# Patient Record
Sex: Female | Born: 1940 | Race: White | Hispanic: No | Smoking: Former smoker
Health system: Southern US, Community
[De-identification: ages and names within clinical notes are randomized; demographics above are authoritative.]

## PROBLEM LIST (undated history)

## (undated) DIAGNOSIS — J302 Other seasonal allergic rhinitis: Secondary | ICD-10-CM

## (undated) DIAGNOSIS — C801 Malignant (primary) neoplasm, unspecified: Secondary | ICD-10-CM

## (undated) DIAGNOSIS — F419 Anxiety disorder, unspecified: Secondary | ICD-10-CM

## (undated) DIAGNOSIS — M8580 Other specified disorders of bone density and structure, unspecified site: Secondary | ICD-10-CM

## (undated) DIAGNOSIS — N3281 Overactive bladder: Secondary | ICD-10-CM

## (undated) DIAGNOSIS — S60419A Abrasion of unspecified finger, initial encounter: Secondary | ICD-10-CM

## (undated) DIAGNOSIS — Z923 Personal history of irradiation: Secondary | ICD-10-CM

## (undated) DIAGNOSIS — I10 Essential (primary) hypertension: Secondary | ICD-10-CM

## (undated) DIAGNOSIS — M1990 Unspecified osteoarthritis, unspecified site: Secondary | ICD-10-CM

## (undated) DIAGNOSIS — T4145XA Adverse effect of unspecified anesthetic, initial encounter: Secondary | ICD-10-CM

## (undated) DIAGNOSIS — H269 Unspecified cataract: Secondary | ICD-10-CM

## (undated) HISTORY — PX: CATARACT EXTRACTION: SUR2

## (undated) HISTORY — PX: BREAST SURGERY: SHX581

## (undated) HISTORY — PX: COLONOSCOPY: SHX174

## (undated) HISTORY — DX: Other specified disorders of bone density and structure, unspecified site: M85.80

## (undated) HISTORY — DX: Anxiety disorder, unspecified: F41.9

## (undated) HISTORY — DX: Essential (primary) hypertension: I10

## (undated) SURGERY — Surgical Case
Anesthesia: *Unknown

---
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
## 1971-10-11 DIAGNOSIS — T8859XA Other complications of anesthesia, initial encounter: Secondary | ICD-10-CM

## 1971-10-11 HISTORY — PX: TUBAL LIGATION: SHX77

## 1971-10-11 HISTORY — DX: Other complications of anesthesia, initial encounter: T88.59XA

## 2000-03-07 ENCOUNTER — Encounter: Payer: Self-pay | Admitting: *Deleted

## 2000-03-07 ENCOUNTER — Encounter: Admission: RE | Admit: 2000-03-07 | Discharge: 2000-03-07 | Payer: Self-pay | Admitting: *Deleted

## 2000-06-30 ENCOUNTER — Encounter: Admission: RE | Admit: 2000-06-30 | Discharge: 2000-06-30 | Payer: Self-pay | Admitting: Obstetrics and Gynecology

## 2000-06-30 ENCOUNTER — Encounter: Payer: Self-pay | Admitting: Obstetrics and Gynecology

## 2000-12-27 ENCOUNTER — Ambulatory Visit (HOSPITAL_COMMUNITY): Admission: RE | Admit: 2000-12-27 | Discharge: 2000-12-27 | Payer: Self-pay | Admitting: Obstetrics and Gynecology

## 2000-12-27 ENCOUNTER — Encounter: Payer: Self-pay | Admitting: Obstetrics and Gynecology

## 2001-01-31 ENCOUNTER — Ambulatory Visit (HOSPITAL_COMMUNITY): Admission: RE | Admit: 2001-01-31 | Discharge: 2001-01-31 | Payer: Self-pay | Admitting: Obstetrics and Gynecology

## 2001-01-31 ENCOUNTER — Encounter (INDEPENDENT_AMBULATORY_CARE_PROVIDER_SITE_OTHER): Payer: Self-pay

## 2001-01-31 ENCOUNTER — Encounter: Payer: Self-pay | Admitting: Obstetrics and Gynecology

## 2001-01-31 HISTORY — PX: DILATION AND CURETTAGE OF UTERUS: SHX78

## 2001-02-23 ENCOUNTER — Encounter: Payer: Self-pay | Admitting: Obstetrics and Gynecology

## 2001-02-23 ENCOUNTER — Encounter: Admission: RE | Admit: 2001-02-23 | Discharge: 2001-02-23 | Payer: Self-pay | Admitting: Obstetrics and Gynecology

## 2001-08-20 ENCOUNTER — Encounter: Payer: Self-pay | Admitting: Obstetrics and Gynecology

## 2001-08-20 ENCOUNTER — Encounter: Admission: RE | Admit: 2001-08-20 | Discharge: 2001-08-20 | Payer: Self-pay | Admitting: Obstetrics and Gynecology

## 2001-10-10 HISTORY — PX: POLYPECTOMY: SHX149

## 2001-11-09 ENCOUNTER — Encounter (INDEPENDENT_AMBULATORY_CARE_PROVIDER_SITE_OTHER): Payer: Self-pay | Admitting: *Deleted

## 2002-08-21 ENCOUNTER — Encounter: Payer: Self-pay | Admitting: Obstetrics and Gynecology

## 2002-08-21 ENCOUNTER — Encounter: Admission: RE | Admit: 2002-08-21 | Discharge: 2002-08-21 | Payer: Self-pay | Admitting: Obstetrics and Gynecology

## 2003-04-11 ENCOUNTER — Encounter: Admission: RE | Admit: 2003-04-11 | Discharge: 2003-04-11 | Payer: Self-pay | Admitting: Orthopedic Surgery

## 2003-04-11 ENCOUNTER — Encounter: Payer: Self-pay | Admitting: Orthopedic Surgery

## 2003-08-28 ENCOUNTER — Encounter: Admission: RE | Admit: 2003-08-28 | Discharge: 2003-08-28 | Payer: Self-pay | Admitting: Obstetrics and Gynecology

## 2004-02-05 ENCOUNTER — Other Ambulatory Visit: Admission: RE | Admit: 2004-02-05 | Discharge: 2004-02-05 | Payer: Self-pay | Admitting: Obstetrics and Gynecology

## 2004-02-10 ENCOUNTER — Ambulatory Visit (HOSPITAL_COMMUNITY): Admission: RE | Admit: 2004-02-10 | Discharge: 2004-02-10 | Payer: Self-pay | Admitting: Obstetrics and Gynecology

## 2004-03-04 ENCOUNTER — Other Ambulatory Visit: Admission: RE | Admit: 2004-03-04 | Discharge: 2004-03-04 | Payer: Self-pay | Admitting: Obstetrics and Gynecology

## 2004-08-31 ENCOUNTER — Encounter: Admission: RE | Admit: 2004-08-31 | Discharge: 2004-08-31 | Payer: Self-pay | Admitting: Obstetrics and Gynecology

## 2005-02-10 ENCOUNTER — Other Ambulatory Visit: Admission: RE | Admit: 2005-02-10 | Discharge: 2005-02-10 | Payer: Self-pay | Admitting: Obstetrics and Gynecology

## 2005-07-18 ENCOUNTER — Ambulatory Visit: Payer: Self-pay | Admitting: Internal Medicine

## 2005-07-22 ENCOUNTER — Ambulatory Visit: Payer: Self-pay | Admitting: Gastroenterology

## 2005-09-16 ENCOUNTER — Ambulatory Visit: Payer: Self-pay | Admitting: Gastroenterology

## 2005-10-12 ENCOUNTER — Encounter: Admission: RE | Admit: 2005-10-12 | Discharge: 2005-10-12 | Payer: Self-pay | Admitting: Obstetrics and Gynecology

## 2005-12-28 ENCOUNTER — Ambulatory Visit: Payer: Self-pay | Admitting: Gastroenterology

## 2006-02-13 ENCOUNTER — Other Ambulatory Visit: Admission: RE | Admit: 2006-02-13 | Discharge: 2006-02-13 | Payer: Self-pay | Admitting: Obstetrics and Gynecology

## 2006-03-01 ENCOUNTER — Encounter: Admission: RE | Admit: 2006-03-01 | Discharge: 2006-03-01 | Payer: Self-pay | Admitting: Obstetrics and Gynecology

## 2006-09-25 ENCOUNTER — Ambulatory Visit: Payer: Self-pay | Admitting: Gastroenterology

## 2006-10-13 ENCOUNTER — Encounter: Admission: RE | Admit: 2006-10-13 | Discharge: 2006-10-13 | Payer: Self-pay | Admitting: Obstetrics and Gynecology

## 2006-10-18 ENCOUNTER — Encounter: Admission: RE | Admit: 2006-10-18 | Discharge: 2006-10-18 | Payer: Self-pay | Admitting: Obstetrics and Gynecology

## 2006-10-20 ENCOUNTER — Encounter (INDEPENDENT_AMBULATORY_CARE_PROVIDER_SITE_OTHER): Payer: Self-pay | Admitting: *Deleted

## 2006-10-20 ENCOUNTER — Ambulatory Visit: Payer: Self-pay | Admitting: Gastroenterology

## 2007-02-26 ENCOUNTER — Other Ambulatory Visit: Admission: RE | Admit: 2007-02-26 | Discharge: 2007-02-26 | Payer: Self-pay | Admitting: Obstetrics and Gynecology

## 2007-04-19 ENCOUNTER — Encounter: Admission: RE | Admit: 2007-04-19 | Discharge: 2007-04-19 | Payer: Self-pay | Admitting: Obstetrics and Gynecology

## 2007-11-29 ENCOUNTER — Encounter: Admission: RE | Admit: 2007-11-29 | Discharge: 2007-11-29 | Payer: Self-pay | Admitting: Obstetrics and Gynecology

## 2007-12-07 ENCOUNTER — Encounter: Admission: RE | Admit: 2007-12-07 | Discharge: 2007-12-07 | Payer: Self-pay | Admitting: Obstetrics and Gynecology

## 2008-03-05 ENCOUNTER — Other Ambulatory Visit: Admission: RE | Admit: 2008-03-05 | Discharge: 2008-03-05 | Payer: Self-pay | Admitting: Obstetrics and Gynecology

## 2008-09-03 ENCOUNTER — Encounter: Admission: RE | Admit: 2008-09-03 | Discharge: 2008-09-03 | Payer: Self-pay | Admitting: Family Medicine

## 2008-12-10 ENCOUNTER — Encounter: Admission: RE | Admit: 2008-12-10 | Discharge: 2008-12-10 | Payer: Self-pay | Admitting: Obstetrics and Gynecology

## 2009-05-06 ENCOUNTER — Other Ambulatory Visit: Admission: RE | Admit: 2009-05-06 | Discharge: 2009-05-06 | Payer: Self-pay | Admitting: Obstetrics and Gynecology

## 2009-08-25 ENCOUNTER — Encounter: Admission: RE | Admit: 2009-08-25 | Discharge: 2009-08-25 | Payer: Self-pay | Admitting: Family Medicine

## 2009-09-15 ENCOUNTER — Encounter: Admission: RE | Admit: 2009-09-15 | Discharge: 2009-09-15 | Payer: Self-pay | Admitting: Family Medicine

## 2009-12-01 ENCOUNTER — Ambulatory Visit (HOSPITAL_COMMUNITY)
Admission: RE | Admit: 2009-12-01 | Discharge: 2009-12-03 | Payer: Self-pay | Source: Home / Self Care | Admitting: Obstetrics and Gynecology

## 2009-12-01 HISTORY — PX: LAPAROSCOPIC ASSISTED VAGINAL HYSTERECTOMY: SHX5398

## 2009-12-11 ENCOUNTER — Encounter: Admission: RE | Admit: 2009-12-11 | Discharge: 2009-12-11 | Payer: Self-pay | Admitting: Family Medicine

## 2010-04-06 ENCOUNTER — Encounter: Payer: Self-pay | Admitting: Gastroenterology

## 2010-05-14 ENCOUNTER — Ambulatory Visit: Payer: Self-pay | Admitting: Gastroenterology

## 2010-05-14 DIAGNOSIS — R197 Diarrhea, unspecified: Secondary | ICD-10-CM | POA: Insufficient documentation

## 2010-05-17 LAB — CONVERTED CEMR LAB: IgA: 319 mg/dL (ref 68–378)

## 2010-05-19 LAB — CONVERTED CEMR LAB: Tissue Transglutaminase Ab, IgA: 12 units (ref <20)

## 2010-06-28 ENCOUNTER — Encounter
Admission: RE | Admit: 2010-06-28 | Discharge: 2010-09-26 | Payer: Self-pay | Source: Home / Self Care | Attending: Endocrinology | Admitting: Endocrinology

## 2010-11-09 NOTE — Procedures (Signed)
Summary: Colon   Colonoscopy  Procedure date:  10/20/2006  Findings:      Location:  [facility].    Colonoscopy  Procedure date:  10/20/2006  Findings:      Location:  [facility].   Patient Name: Joy Owens, Joy Owens MRN:  Procedure Procedures: Colonoscopy CPT: [id number].    with polypectomy. CPT: [id number].  Personnel: Endoscopist: Rachael Fee, MD.  Exam Location: Exam performed in Endoscopy Suite. Outpatient  Patient Consent: Procedure, Alternatives, Risks and Benefits discussed, consent obtained, from patient. Consent was obtained by the RN.  Indications  Surveillance of: Adenomatous Polyp(s). Initial polypectomy was performed in 2003.  History  Current Medications: Patient is not currently taking Coumadin.  Allergies: No known allergies.  Comments: Patient history reviewed and updated, pre-procedure physical performed prior to initiation of sedation? yes Pre-Exam Physical: Performed Oct 20, 2006. Cardio-pulmonary exam, Cardio-pulmonary exam, Abdominal exam, Abdominal exam, Mental status exam, Mental status exam WNL.  Exam Exam: Extent of exam reached: Cecum, extent intended: Cecum.  The cecum was identified by appendiceal orifice and IC valve. Patient position: on left side. Time to Cecum: 00:06:59. Time for Withdrawl: 00:07:48. Colon retroflexion performed. Images taken. ASA Classification: II. Tolerance: good.  Monitoring: Pulse and BP monitoring, Oximetry used. Supplemental O2 given.  Colon Prep Prep results: good.  Sedation Meds: Patient assessed and found to be appropriate for moderate (conscious) sedation. Fentanyl 100 mcg. given IV. Versed 8 mg. given IV.  Findings POLYP: Descending Colon, Maximum size: 3 mm. sessile polyp. Procedure:  snare without cautery, removed, retrieved, Polyp sent to pathology. Path # 1.  - DIVERTICULOSIS: Descending Colon to Sigmoid Colon.  - NORMAL EXAM: Cecum to Rectum. Comments: otherwise  normal examination.   Assessment Abnormal examination, see findings above.  Comments: Single small colon polyp, no cancers. Events  Unplanned Interventions: No intervention was required.  Unplanned Events: There were no complications. Plans Comments: Even if this polyp is hyperplastic, she will need repeat colonoscopy in 5 years given her personal history of adenomatous polyps (2003). Scheduling/Referral: Await pathology to schedule patient.  This report was created from the original endoscopy report, which was reviewed and signed by the above listed endoscopist.

## 2010-11-09 NOTE — Assessment & Plan Note (Signed)
History of Present Illness Visit Type: Initial Visit Primary GI MD: Rob Bunting MD Primary Provider: Maurice Small, MD Chief Complaint: Uncontrollable loose bowels within one hour after meals. History of Present Illness:     very pleasant 70 year old woman whom I last saw in 2008 for polyp surveillance. She is having trouble with urinary bladder, urgency. She is also having issues with bowels, urgency, fecal incontinence.  She has had post prandial urgency at times as well, more recently usually loose now as well.    She feels she lack bowel control in the past 6 months.  She had a hysterectomy in February with Dr. Perlie Gold.  She saw a urologist in past few months, was given gelnique...this helps some with the bladder problems.  she will have 1 bm a day, sometimes twice a day.  Urgency is the biggest issue.  still has GB in place.  she has been on zoloft for years, no changes in doses. she has been on micardis for 6-7 years,  no recent changes in doses.  has not tried immodium.  no antibiotics recently.           Current Medications (verified): 1)  Micardis 40 Mg Tabs (Telmisartan) .... One Tablet By Mouth Once Daily 2)  Sertraline Hcl 50 Mg Tabs (Sertraline Hcl) .... One Tablet By Mouth Once Daily 3)  Gelnique 10 % Gel (Oxybutynin Chloride) .... Apply Once Every Two Days 4)  Ambien 5 Mg Tabs (Zolpidem Tartrate) .... One Tablet By Mouth At Bedtime As Needed  Allergies (verified): No Known Drug Allergies  Past History:  Past Medical History: dyspepsia colon polyps, recall colonoscopy 2013 cholelithiasis, incidental Hypertension  Past Surgical History: Tubal Ligation  hysterectomy 2011  Family History: no colon cancer  Social History: she is divorced, she has 2 children, she is retired, she quit smoking, she drinks one large tea per day, one glass of wine per day  Review of Systems       Pertinent positive and negative review of systems were noted in the  above HPI and GI specific review of systems.  All other review of systems was otherwise negative.   Vital Signs:  Patient profile:   70 year old female Height:      62.25 inches Weight:      179.25 pounds BMI:     32.64 Pulse rate:   80 / minute Pulse rhythm:   regular BP sitting:   122 / 72  (left arm) Cuff size:   regular  Vitals Entered By: Christie Nottingham CMA Duncan Dull) (May 14, 2010 10:09 AM)  Physical Exam  Additional Exam:  Constitutional: generally well appearing Psychiatric: alert and oriented times 3 Eyes: extraocular movements intact Mouth: oropharynx moist, no lesions Neck: supple, no lymphadenopathy Cardiovascular: heart regular rate and rythm Lungs: CTA bilaterally Abdomen: soft, non-tender, non-distended, no obvious ascites, no peritoneal signs, normal bowel sounds Extremities: no lower extremity edema bilaterally Skin: no lesions on visible extremities    Impression & Recommendations:  Problem # 1:  loose stools, fecal incontinence this may be dietary related, she drinks a 32 ounce tea everyday. It does seem like she has some issues with lactose as well. We will get recent labs sent over to see if her thyroid has been checked. If not we will add TSH to her labs. I will check her for celiac sprue serologically. Depending on those results we may proceed with a flexible sigmoidoscopy. In the meantime she will take one Imodium on a  scheduled basis every day.  Other Orders: TLB-IgA (Immunoglobulin A) ([id number]) T-Tissue Transglutamase Ab IgA ([id number])  Patient Instructions: 1)  You will get lab test(s) done today (tTG, total IgA). 2)  Will get records from Dr. Tennis Must recent lab tests. 3)  Will decide about flex sigmoidoscopy based on lab results. 4)  In the meantime, take one immodium a day in the AM on a scheduled basis, only stop if you become constipated. 5)  The medication list was reviewed and reconciled.  All changed / newly prescribed medications  were explained.  A complete medication list was provided to the patient / caregiver.  Appended Document:  received faxed labs dated 04/06/2010: thryoid profile was all normal.

## 2010-11-09 NOTE — Procedures (Signed)
Summary: Colon   Colonoscopy  Procedure date:  11/09/2001  Findings:      Location:  [facility].    Colonoscopy  Procedure date:  11/09/2001  Findings:      Location:  [facility].   Patient Name: Joy Owens, Doyel MRN:  Procedure Procedures: Colonoscopy CPT: [phone].  Personnel: Endoscopist: Ulyess Mort, MD.  Referred By: Beather Arbour. Thomasena Edis, MD.  Exam Location: Exam performed in Outpatient Clinic. Outpatient  Patient Consent: Procedure, Alternatives, Risks and Benefits discussed, consent obtained, from patient. Consent was obtained by the RN.  Indications  Average Risk Screening Routine.  History Allergies: No known allergies.  Pre-Exam Physical: Performed Nov 09, 2001. Cardio-pulmonary exam, Rectal exam, HEENT exam , Abdominal exam, Neurological exam, Mental status exam WNL.  Exam Exam: Extent of exam reached: Cecum, extent intended: Cecum.  The cecum was identified by appendiceal orifice and IC valve. Colon retroflexion performed. Images taken. ASA Classification: I. Tolerance: good.  Monitoring: Pulse and BP monitoring, Oximetry used. Supplemental O2 given.  Colon Prep Prep results: excellent.  Sedation Meds: Patient assessed and found to be appropriate for moderate (conscious) sedation. Fentanyl 100 mcg. given IV. Versed 10 mg. given IV.  Findings - NOT SEEN ON EXAM: Cecum to Sigmoid Colon. Polyps, AVM's, Colitis, Tumors, Melanosis, Crohn's, Hemorrhoids, Comments: mild l sigmoid diverticulosis.   Assessment Abnormal examination, see findings above.  Events  Unplanned Interventions: No intervention was required.  Unplanned Events: There were no complications. Plans Medication Plan: Fiber supplements:   Patient Education: Patient given standard instructions for: Diverticulosis. Yearly hemoccult testing recommended. Patient instructed to get routine colonoscopy every 5 years.  Disposition: After procedure  patient sent to recovery. After recovery patient sent home.  This report was created from the original endoscopy report, which was reviewed and signed by the above listed endoscopist.

## 2010-11-11 ENCOUNTER — Other Ambulatory Visit: Payer: Self-pay | Admitting: Obstetrics and Gynecology

## 2010-11-11 DIAGNOSIS — Z1239 Encounter for other screening for malignant neoplasm of breast: Secondary | ICD-10-CM

## 2010-12-13 ENCOUNTER — Ambulatory Visit
Admission: RE | Admit: 2010-12-13 | Discharge: 2010-12-13 | Disposition: A | Discharge status: Home / Self Care | Payer: Medicare Other | Source: Ambulatory Visit | Attending: Obstetrics and Gynecology | Admitting: Obstetrics and Gynecology

## 2010-12-13 DIAGNOSIS — Z1239 Encounter for other screening for malignant neoplasm of breast: Secondary | ICD-10-CM

## 2010-12-29 LAB — CBC
HCT: 32.6 % — ABNORMAL LOW (ref 36.0–46.0)
HCT: 40.3 % (ref 36.0–46.0)
Hemoglobin: 11.1 g/dL — ABNORMAL LOW (ref 12.0–15.0)
Hemoglobin: 13.7 g/dL (ref 12.0–15.0)
MCHC: 33.9 g/dL (ref 30.0–36.0)
MCHC: 33.9 g/dL (ref 30.0–36.0)
MCV: 88.9 fL (ref 78.0–100.0)
MCV: 89.9 fL (ref 78.0–100.0)
Platelets: 244 10*3/uL (ref 150–400)
Platelets: 288 10*3/uL (ref 150–400)
RBC: 3.63 MIL/uL — ABNORMAL LOW (ref 3.87–5.11)
RBC: 4.53 MIL/uL (ref 3.87–5.11)
RDW: 12.9 % (ref 11.5–15.5)
RDW: 12.9 % (ref 11.5–15.5)
WBC: 10.2 10*3/uL (ref 4.0–10.5)
WBC: 5.4 10*3/uL (ref 4.0–10.5)

## 2010-12-29 LAB — BASIC METABOLIC PANEL
BUN: 16 mg/dL (ref 6–23)
CO2: 28 mEq/L (ref 19–32)
Calcium: 9.4 mg/dL (ref 8.4–10.5)
Chloride: 105 mEq/L (ref 96–112)
Creatinine, Ser: 0.9 mg/dL (ref 0.4–1.2)
GFR calc Af Amer: >60 mL/min (ref >60)
GFR calc non Af Amer: >60 mL/min (ref >60)
Glucose, Bld: 100 mg/dL — ABNORMAL HIGH (ref 70–99)
Potassium: 4 mEq/L (ref 3.5–5.1)
Sodium: 141 mEq/L (ref 135–145)

## 2011-02-09 ENCOUNTER — Ambulatory Visit: Payer: BC Managed Care – PPO | Admitting: Dietician

## 2011-02-25 NOTE — Op Note (Signed)
[facility]  Patient:    Joy Owens, Joy Owens                    MRN: [id number] Proc. Date: 01/31/01 Adm. Date:  [id number] Attending:  Madelyn Flavors CC:         Deboraha Sprang GYN  Dr. [facility] [employer]   Operative Report  PREOPERATIVE DIAGNOSES:       1. Postmenopausal bleeding.                               2. Endometrial polyp sonohystogram.  POSTOPERATIVE DIAGNOSES:      1. Postmenopausal bleeding.                               2. Endometrial polyp sonohystogram.  PROCEDURES:                   1. Dilatation and curettage.                               2. Hysteroscopy.  SURGEON:                      Beather Arbour. Thomasena Edis, M.D.  ANESTHESIA:                   Monitored anesthesia care plus 10 cc 1% lidocaine paracervical block.  DRAINS:                       None.  COMPLICATIONS:                None.  FLUIDS:                       Approximately 1000 cc of crystalloid,  FINDINGS:                     Polyp on the anterior uterine wall.  DESCRIPTION OF PROCEDURE:     The patient was brought to the operating room and identified on the operating room table.  After the patient was adequately sedated using monitored anesthesia care, she was placed in the dorsal lithotomy position, prepped and draped in the usual sterile fashion.  The bladder was straight catheterized for approximately 100 cc of clear yellow urine.  It should be noted that it was quite difficult to get the patient to open her legs and place them into the [place] stirrups.  In fact, she kept them clenched together and, only with my coaxing was she willing to let her legs fall apart and allow [place] to place them in stirrups.  Bimanual examination was performed.  The uterus was noted to be anteverted, approximately six week size with no adnexal mass palpated.  A speculum was placed and the anterior lip of the cervix was infiltrated with 1 cc of 1% lidocaine and  grasped with a single-tooth tenaculum.  The remaining 9 cc of 1% lidocaine was placed for paracervical block.  The cervix was very carefully and gently dilated up to a #25 Pratt dilator.  Dilatation proceeded very carefully and slowly to decrease the risk of uterine perforation.  Initially, it was slightly difficult to dilate the patients cervix, as the internal os and external os  did not line up together.  However, just with allowing very gentle pressure, the dilator was allowed to fall into the internal os.  The ACMI scope was placed after the uterus sounded to approximately 7 cm.  A very careful and thorough hysteroscopic examination was performed.  Both tubal ostia were identified. The polyp was identified on the anterior wall.  There was some thickened endometrial tissue on the posterior wall of the uterus and adjacent to the left tubal ostium.  After carefully examining the endometrial cavity, the scope was withdrawn and curettage was performed in a systematic cross fashion, with tissue obtained.  The Randall stone forceps were placed and additional tissue was obtained.  The ACMI scope was again placed and the polyp was noted to have been removed in its entirety as well as the endometrial tissue at the left tubal ostium.  The thickened endometrium on the posterior wall was also noted to have been removed.  At that point, the procedure was then terminated. The tenaculum was withdrawn after removal of the scope.  There was noted to be no bleeding from the tenaculum site.  The speculum was withdrawn.  The patient tolerated the procedure well without apparent complications and was transferred to the recovery room in stable condition after sponge, needle and instrument counts were correct.  The patient was given a post D&C instruction sheet, urged to call with any problems, and to return in 2-3 weeks for a postoperative evaluation.  She was urged to use ibuprofen, 400-600 mg q.6h. p.r.n.  pain and to call for any temperature greater than 100 degrees or any problem.  She is to refrain from intercourse for 2-3 weeks until her postoperative visit. DD:  01/31/01 TD:  02/01/01 Job: [id number]

## 2011-02-25 NOTE — H&P (Signed)
[facility]  Patient:    Joy Owens, Joy Owens                      MRN: [id number] Adm. Date:  01/31/01 Dictator:   Beather Arbour. Thomasena Edis, M.D. CC:         Al Decant. Janey Greaser, M.D.,  [facility]   History and Physical  HISTORY OF PRESENT ILLNESS:   The patient is a 70 year old gravida 2, para 2, Caucasian female, originally seen on December 20, 2000, for annual examination. In taking her history I discovered that approximately six months or a year or so ago she took unopposed estrogen.  Her mother was recently diagnosed with endometrial cancer, having taken unopposed estrogen for years and without informing her physician.  At that time the patient also was complaining of some postmenopausal bleeding.  She subsequently underwent an ultrasound and sonohysterogram which showed a normal uterus at 8.5 cm x 3.7 x 4.8.  Ovaries were normal.  Sonohysterogram revealed a small endometrial polyp, and the patient is now admitted for a D&C and hysteroscopy to remove the polyp and obtain endometrial tissue to rule out endometrial hyperplasia due to her history of unopposed estrogen therapy.  PAST MEDICAL HISTORY: 1. History of IUD years ago without any infection. 2. 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 3. Postmenopausal bleeding.  The patient has been bleeding 10-14 days each    month since last summer. 4. Osteopenia of the left hip. 5. In addition, the patient was in our office complaining of some shortness    of breath and lightheadedness for which she was evaluated by [facility].  ALLERGIES:                    No known drug allergies.  CURRENT MEDICATIONS:          Premphase and Fosamax.  SOCIAL HISTORY:               The patient recently retired.  She does not smoke, has five alcohol drinks per week.  PAST SURGICAL HISTORY:        Tubal ligation years ago, otherwise  negative.  INJURIES:                     None.  FAMILY HISTORY:               Father age 53 with hypertension, possible Parkinsons disease and degenerative eye disease.  Mother age 74 recently diagnosed with endometrial cancer and also with a history of hypertension. The patient has one brother age 68 with coronary artery disease treated with angioplasty and a sister age 22 alive and well.  She does give a family history significant for heart disease in many relatives.  She has two daughters both of whom are alive and well.  PHYSICAL EXAMINATION:  GENERAL:                      A well-developed Caucasian female.  VITAL SIGNS:                  Blood pressure 148/94, height 62-1/4 inches. LMP November 28, 2000.  HEENT:                        Normal.  NECK:  Supple without thyromegaly, adenopathy or nodules.  LUNGS:                        Clear to auscultation.  CARDIAC EXAM:                 Regular rate and rhythm without murmurs, rubs or gallops.  BREASTS:                      Without masses, nodes, dimpling, retraction.  ABDOMEN:                      Soft, nontender, no hepatosplenomegaly, no masses.  PELVIC EXAMINATION:           BUS normal.  A careful inspection of the vulva, vagina and cervix reveals no visible lesions.  Pap smear deep in the canal was performed.  Uterus is midposition, six weeks, mobile, without any adnexal mass palpated.  RECTAL:                       Confirmatory, no mass.  ASSESSMENT AND PLAN:          The patient is a 70 year old gravida 2, para 2, Caucasian female with a history of unopposed estrogen use, postmenopausal bleeding since last summer and an endometrial polyp on sonohysterogram admitted for a D&C and hysteroscopy.  The risks of surgery including anesthetic complication; hemorrhage; infection; damage to adjacent structures including bladder, bowel, blood vessels, ureters discussed with the patient. She was made  aware of the risk of uterine perforation which could result in overwhelming life-threatening hemorrhage requiring emergent hysterectomy.  She expressed understanding of and acceptance of these risks.  In addition, she was made aware of the risk of uterine perforation which could result in bowel damage requiring emergent colostomy or which could result in overwhelming life-threatening peritonitis.  She expressed understanding of and acceptance of these risks. DD:  02/01/99 TD:  01/31/01 Job: [id number]

## 2011-03-03 ENCOUNTER — Encounter: Payer: Medicare Other | Attending: Endocrinology | Admitting: Dietician

## 2011-08-22 ENCOUNTER — Other Ambulatory Visit: Payer: Self-pay | Admitting: Obstetrics and Gynecology

## 2011-08-22 DIAGNOSIS — N63 Unspecified lump in unspecified breast: Secondary | ICD-10-CM

## 2011-09-05 ENCOUNTER — Ambulatory Visit
Admission: RE | Admit: 2011-09-05 | Discharge: 2011-09-05 | Disposition: A | Discharge status: Home / Self Care | Payer: Medicare Other | Source: Ambulatory Visit | Attending: Family Medicine | Admitting: Family Medicine

## 2011-09-05 ENCOUNTER — Ambulatory Visit
Admission: RE | Admit: 2011-09-05 | Discharge: 2011-09-05 | Disposition: A | Discharge status: Home / Self Care | Payer: BC Managed Care – PPO | Source: Ambulatory Visit | Attending: Obstetrics and Gynecology | Admitting: Obstetrics and Gynecology

## 2011-09-05 ENCOUNTER — Other Ambulatory Visit: Payer: Self-pay | Admitting: Family Medicine

## 2011-09-05 DIAGNOSIS — R05 Cough: Secondary | ICD-10-CM

## 2011-09-05 DIAGNOSIS — R059 Cough, unspecified: Secondary | ICD-10-CM

## 2011-09-05 DIAGNOSIS — N63 Unspecified lump in unspecified breast: Secondary | ICD-10-CM

## 2011-09-22 ENCOUNTER — Encounter: Payer: Self-pay | Admitting: Gastroenterology

## 2011-10-05 ENCOUNTER — Ambulatory Visit (AMBULATORY_SURGERY_CENTER): Payer: Medicare Other

## 2011-10-05 DIAGNOSIS — H269 Unspecified cataract: Secondary | ICD-10-CM | POA: Insufficient documentation

## 2011-10-05 DIAGNOSIS — Z1211 Encounter for screening for malignant neoplasm of colon: Secondary | ICD-10-CM

## 2011-10-05 MED ORDER — PEG-KCL-NACL-NASULF-NA ASC-C 100 G PO SOLR: 1.0000 | Freq: Once | ORAL | Status: DC

## 2011-10-19 ENCOUNTER — Ambulatory Visit (AMBULATORY_SURGERY_CENTER): Payer: Medicare Other | Admitting: Gastroenterology

## 2011-10-19 ENCOUNTER — Encounter: Payer: Self-pay | Admitting: Gastroenterology

## 2011-10-19 VITALS — BP 113/70 | HR 64 | Temp 97.6°F | Resp 20 | Ht 62.5 in | Wt 160.0 lb

## 2011-10-19 DIAGNOSIS — Z1211 Encounter for screening for malignant neoplasm of colon: Secondary | ICD-10-CM

## 2011-10-19 DIAGNOSIS — K5730 Diverticulosis of large intestine without perforation or abscess without bleeding: Secondary | ICD-10-CM

## 2011-10-19 DIAGNOSIS — Z86010 Personal history of colonic polyps: Secondary | ICD-10-CM

## 2011-10-19 MED ORDER — SODIUM CHLORIDE 0.9 % IV SOLN: 500.0000 mL | INTRAVENOUS | Status: DC

## 2011-10-19 NOTE — Progress Notes (Signed)
Abdominal pressure applied to reach cecum 

## 2011-10-19 NOTE — Patient Instructions (Signed)
Resume all medications. D/Cinstructions reviewed with family.

## 2011-10-19 NOTE — Progress Notes (Signed)
Patient did not experience any of the following events: a burn prior to discharge; a fall within the facility; wrong site/side/patient/procedure/implant event; or a hospital transfer or hospital admission upon discharge from the facility. [phone]) Patient did not have preoperative order for IV antibiotic SSI prophylaxis. (J4782) Dr. Christella Hartigan MADE AWARE OF B/P PRIOR TO D/C PT. Stable skin warm and dry A&O X3.

## 2011-10-19 NOTE — Op Note (Signed)
[facility] [address] [place], [place]  [address]  COLONOSCOPY PROCEDURE REPORT  PATIENT:  Joy Owens, Joy Owens  MR#:  [id number] BIRTHDATE:  1940-12-16, 70 yrs. old  GENDER:  female ENDOSCOPIST:  Rachael Fee, MD PROCEDURE DATE:  10/19/2011 PROCEDURE:  Colonoscopy 62130 ASA CLASS:  Class II INDICATIONS:  adenomatous polyp 2003, HP in 2008 MEDICATIONS:   Fentanyl 50 mcg IV, These medications were titrated to patient response per physician's verbal order, Versed 8 mg IV  DESCRIPTION OF PROCEDURE:   After the risks benefits and alternatives of the procedure were thoroughly explained, informed consent was obtained.  Digital rectal exam was performed and revealed no rectal masses.   The LB [id number] endoscope was introduced through the anus and advanced to the cecum, which was identified by both the appendix and ileocecal valve, without limitations.  The quality of the prep was good..  The instrument was then slowly withdrawn as the colon was fully examined. <<PROCEDUREIMAGES>> FINDINGS:  Mild diverticulosis was found in the sigmoid to descending colon segments (see image1).  This was otherwise a normal examination of the colon (see image2, image3, and image4). Retroflexed views in the rectum revealed no abnormalities. COMPLICATIONS:  None  ENDOSCOPIC IMPRESSION: 1) Mild diverticulosis in the sigmoid to descending colon segments 2) Otherwise normal examination  RECOMMENDATIONS: 1) Given your personal history of adenomatous (pre-cancerous) polyps, you will need a repeat colonoscopy in 5 years.  REPEAT EXAM:  5 years  ______________________________ Rachael Fee, MD  n. eSIGNED:   Rachael Fee at 10/19/2011 09:39 AM  Fosse, Myrene Buddy, [phone]

## 2011-10-20 ENCOUNTER — Telehealth: Payer: Self-pay | Admitting: *Deleted

## 2011-10-20 NOTE — Telephone Encounter (Signed)
Left message on number given in admitting yest as per pt. ewm

## 2011-12-01 ENCOUNTER — Other Ambulatory Visit: Payer: Self-pay | Admitting: Obstetrics and Gynecology

## 2011-12-08 ENCOUNTER — Other Ambulatory Visit: Payer: Self-pay | Admitting: Obstetrics and Gynecology

## 2011-12-08 DIAGNOSIS — Z1231 Encounter for screening mammogram for malignant neoplasm of breast: Secondary | ICD-10-CM

## 2011-12-21 ENCOUNTER — Ambulatory Visit
Admission: RE | Admit: 2011-12-21 | Discharge: 2011-12-21 | Disposition: A | Discharge status: Home / Self Care | Payer: Medicare Other | Source: Ambulatory Visit | Attending: Obstetrics and Gynecology | Admitting: Obstetrics and Gynecology

## 2011-12-21 DIAGNOSIS — Z1231 Encounter for screening mammogram for malignant neoplasm of breast: Secondary | ICD-10-CM

## 2011-12-22 ENCOUNTER — Other Ambulatory Visit: Payer: Self-pay | Admitting: Obstetrics and Gynecology

## 2011-12-22 DIAGNOSIS — R928 Other abnormal and inconclusive findings on diagnostic imaging of breast: Secondary | ICD-10-CM

## 2011-12-30 ENCOUNTER — Other Ambulatory Visit: Payer: Self-pay | Admitting: Obstetrics and Gynecology

## 2011-12-30 ENCOUNTER — Ambulatory Visit
Admission: RE | Admit: 2011-12-30 | Discharge: 2011-12-30 | Disposition: A | Discharge status: Home / Self Care | Payer: Medicare Other | Source: Ambulatory Visit | Attending: Obstetrics and Gynecology | Admitting: Obstetrics and Gynecology

## 2011-12-30 ENCOUNTER — Other Ambulatory Visit: Payer: Self-pay | Admitting: Diagnostic Radiology

## 2011-12-30 DIAGNOSIS — R928 Other abnormal and inconclusive findings on diagnostic imaging of breast: Secondary | ICD-10-CM

## 2011-12-30 HISTORY — PX: BREAST BIOPSY: SHX20

## 2012-01-02 ENCOUNTER — Other Ambulatory Visit: Payer: Self-pay | Admitting: Obstetrics and Gynecology

## 2012-01-02 ENCOUNTER — Ambulatory Visit
Admission: RE | Admit: 2012-01-02 | Discharge: 2012-01-02 | Disposition: A | Discharge status: Home / Self Care | Payer: Medicare Other | Source: Ambulatory Visit | Attending: Obstetrics and Gynecology | Admitting: Obstetrics and Gynecology

## 2012-01-02 DIAGNOSIS — R928 Other abnormal and inconclusive findings on diagnostic imaging of breast: Secondary | ICD-10-CM

## 2012-01-02 DIAGNOSIS — C50911 Malignant neoplasm of unspecified site of right female breast: Secondary | ICD-10-CM

## 2012-01-05 ENCOUNTER — Ambulatory Visit
Admission: RE | Admit: 2012-01-05 | Discharge: 2012-01-05 | Disposition: A | Discharge status: Home / Self Care | Payer: Medicare Other | Source: Ambulatory Visit | Attending: Obstetrics and Gynecology | Admitting: Obstetrics and Gynecology

## 2012-01-05 ENCOUNTER — Other Ambulatory Visit: Payer: Self-pay | Admitting: *Deleted

## 2012-01-05 DIAGNOSIS — C50911 Malignant neoplasm of unspecified site of right female breast: Secondary | ICD-10-CM

## 2012-01-05 DIAGNOSIS — C50411 Malignant neoplasm of upper-outer quadrant of right female breast: Secondary | ICD-10-CM | POA: Insufficient documentation

## 2012-01-05 DIAGNOSIS — C50419 Malignant neoplasm of upper-outer quadrant of unspecified female breast: Secondary | ICD-10-CM

## 2012-01-05 MED ORDER — GADOBENATE DIMEGLUMINE 529 MG/ML IV SOLN
15.0000 mL | Freq: Once | INTRAVENOUS | Status: AC | PRN
  Administered 2012-01-05: 15 mL via INTRAVENOUS

## 2012-01-06 ENCOUNTER — Telehealth: Payer: Self-pay | Admitting: Oncology

## 2012-01-06 NOTE — Telephone Encounter (Signed)
Left a message to follow up to make sure all questions were answered regarding her mammograms.  I left my number should she have any more questions.   I asked Dr. Deboraha Sprang to contact the patient.

## 2012-01-09 ENCOUNTER — Telehealth: Payer: Self-pay | Admitting: Oncology

## 2012-01-09 DIAGNOSIS — C801 Malignant (primary) neoplasm, unspecified: Secondary | ICD-10-CM

## 2012-01-09 HISTORY — DX: Malignant (primary) neoplasm, unspecified: C80.1

## 2012-01-09 NOTE — Telephone Encounter (Signed)
I called to follow up with the patient, after Dr. Deboraha Sprang called her-  She is concerned that her newly diagnosed breast cancer was on her previous mammogram.   She would like Dr. Michell Heinrich to review her case and talk to her about it during her consult on Wednesday.  I will relay her request.

## 2012-01-11 ENCOUNTER — Encounter: Payer: Self-pay | Admitting: Oncology

## 2012-01-11 ENCOUNTER — Ambulatory Visit (HOSPITAL_BASED_OUTPATIENT_CLINIC_OR_DEPARTMENT_OTHER): Payer: Medicare Other | Admitting: Surgery

## 2012-01-11 ENCOUNTER — Ambulatory Visit: Payer: Medicare Other

## 2012-01-11 ENCOUNTER — Other Ambulatory Visit (HOSPITAL_BASED_OUTPATIENT_CLINIC_OR_DEPARTMENT_OTHER): Payer: Medicare Other | Admitting: Lab

## 2012-01-11 ENCOUNTER — Ambulatory Visit: Payer: Medicare Other | Attending: Surgery | Admitting: Physical Therapy

## 2012-01-11 ENCOUNTER — Ambulatory Visit
Admission: RE | Admit: 2012-01-11 | Discharge: 2012-01-11 | Disposition: A | Discharge status: Home / Self Care | Payer: Medicare Other | Source: Ambulatory Visit | Attending: Radiation Oncology | Admitting: Radiation Oncology

## 2012-01-11 ENCOUNTER — Telehealth: Payer: Self-pay | Admitting: *Deleted

## 2012-01-11 ENCOUNTER — Ambulatory Visit (HOSPITAL_BASED_OUTPATIENT_CLINIC_OR_DEPARTMENT_OTHER): Payer: Medicare Other | Admitting: Oncology

## 2012-01-11 VITALS — BP 128/83 | HR 67 | Temp 98.5°F | Wt 162.0 lb

## 2012-01-11 VITALS — BP 128/83 | HR 67 | Temp 98.5°F | Ht 62.0 in | Wt 162.1 lb

## 2012-01-11 DIAGNOSIS — M949 Disorder of cartilage, unspecified: Secondary | ICD-10-CM

## 2012-01-11 DIAGNOSIS — Z170 Estrogen receptor positive status [ER+]: Secondary | ICD-10-CM

## 2012-01-11 DIAGNOSIS — R293 Abnormal posture: Secondary | ICD-10-CM | POA: Insufficient documentation

## 2012-01-11 DIAGNOSIS — E559 Vitamin D deficiency, unspecified: Secondary | ICD-10-CM

## 2012-01-11 DIAGNOSIS — C50419 Malignant neoplasm of upper-outer quadrant of unspecified female breast: Secondary | ICD-10-CM

## 2012-01-11 DIAGNOSIS — C50919 Malignant neoplasm of unspecified site of unspecified female breast: Secondary | ICD-10-CM | POA: Insufficient documentation

## 2012-01-11 DIAGNOSIS — IMO0001 Reserved for inherently not codable concepts without codable children: Secondary | ICD-10-CM | POA: Insufficient documentation

## 2012-01-11 LAB — COMPREHENSIVE METABOLIC PANEL
AST: 20 U/L (ref 0–37)
Alkaline Phosphatase: 52 U/L (ref 39–117)
BUN: 24 mg/dL — ABNORMAL HIGH (ref 6–23)
Creatinine, Ser: 0.81 mg/dL (ref 0.50–1.10)
Total Bilirubin: 0.3 mg/dL (ref 0.3–1.2)

## 2012-01-11 LAB — CBC WITH DIFFERENTIAL/PLATELET
BASO%: 1.1 % (ref 0.0–2.0)
EOS%: 3.7 % (ref 0.0–7.0)
HCT: 37.5 % (ref 34.8–46.6)
LYMPH%: 37.1 % (ref 14.0–49.7)
MCH: 30.6 pg (ref 25.1–34.0)
MCHC: 34.9 g/dL (ref 31.5–36.0)
NEUT%: 48.9 % (ref 38.4–76.8)
Platelets: 235 10*3/uL (ref 145–400)

## 2012-01-11 LAB — CANCER ANTIGEN 27.29: CA 27.29: 14 U/mL (ref 0–39)

## 2012-01-11 NOTE — Telephone Encounter (Signed)
gave patient follow up appointment for 02-09-2012 starting at 3:00pm printed out calendar and gave to the patient

## 2012-01-11 NOTE — Patient Instructions (Signed)
[facility] post will schedule surgery for you for a wire localized right breast lumpectomy and sentinel node removal. We will plan to do this as an outpatient. If you've not heard from my office by Thursday afternoon please call and we will check to see why there is a delay. Also call me if you have any questions or issues.

## 2012-01-11 NOTE — Progress Notes (Signed)
Referral MD Dr Maurice Small; Dr Claudie Fisherman  Reason for Referral: 71 year old woman referred for recent diagnosis of breast cancer.    Chief Complaint  Patient presents with  . Breast Cancer  : This patient is the previous good health. She has undergone annual screening mammography. Screening mammogram from 12/21/2011 showed a possible mass in the right breast. She presented on a mammogram in November of 2012 because of a small nodule seen in the right breast. This is felt to be exacerbation cysts. On the current mammogram no other abnormalities were seen. The patient was referred for biopsy. 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 Only a bright T2 lesion midthoracic spine is felt to be a hemangioma.   HPI:   Past Medical History  Diagnosis Date  . Anxiety   . Cataracts, bilateral     left eye  . Hypertension   . Osteopenia   . Diabetes mellitus     controlled with diet  :  Past Surgical History  Procedure Date  . Vaginal hysterectomy with BSO 12/01/2008  . Tubal ligation 1974    adverse reaction to sedation  . Colonoscopy 2003 2008  . Polypectomy 2003   . Tonsillectomy 1953  . Dilation and curettage of uterus 2002  :  Current outpatient prescriptions:calcium carbonate (OS-CAL) 600 MG TABS, Take 600 mg by mouth 2 (two) times daily with a meal., Disp: , Rfl: ;  cholecalciferol (VITAMIN D) 1000 UNITS tablet, Take 1,000 Units by mouth daily., Disp: , Rfl: ;  HYDROMET 5-1.5 MG/5ML syrup, , Disp: , Rfl: ;  MICARDIS HCT 40-12.5 MG per tablet, , Disp: , Rfl: ;  Multiple Vitamins-Minerals (MULTIVITAMIN PO), Take 1 tablet by mouth daily., Disp: , Rfl:  solifenacin (VESICARE) 5 MG tablet, Take 10 mg by  mouth daily., Disp: , Rfl: ;  zolpidem (AMBIEN) 5 MG tablet, Take 5 mg by mouth at bedtime as needed., Disp: , Rfl: :    :  No Known Allergies:  Family History  Problem Relation Age of Onset  . Colon cancer Neg Hx   :  History   Social History  . Marital Status: Divorced mx 72 y    Spouse Name: N/A    Number of Children: 2; david Arpino 41, melanie chrismon 44  . Years of Education: N/A   Occupational History  . Retired  [employer], social service    Social History Main Topics  . Smoking status: Former Smoker -- 1.5 packs/day for 15 years    Types: Cigarettes    Quit date: 10/10/1988  . Smokeless tobacco: Never Used  . Alcohol Use: 0.0 oz/week    5-7 Glasses of wine per week  . Drug Use: No  . Sexually Active: Not on file   Other Topics Concern  . Not on file   Social History Narrative   Retired from the [employer] [place] [employer] Service Divorced  :  @Reproductive  History@ G2P2 Menarche 14 Menopause @ hysterectomy HRT  X 14-15y  A comprehensive review of systems was negative.  Exam:  @IPVITALS @ General appearance: alert, cooperative and appears stated age Eyes: conjunctivae/corneas clear. PERRL, EOM's intact. Fundi benign. Throat: lips, mucosa, and tongue normal; teeth and gums normal Resp: clear to auscultation  bilaterally and normal percussion bilaterally Breasts: normal appearance, no masses or tenderness Cardio: regular rate and rhythm, S1, S2 normal, no murmur, click, rub or gallop GI: soft, non-tender; bowel sounds normal; no masses,  no organomegaly Extremities: extremities normal, atraumatic, no cyanosis or edema Pulses: 2+ and symmetric Lymph nodes: Cervical, supraclavicular, and axillary nodes normal. Neurologic: Grossly normal   Basename 01/11/12 0823  WBC 5.3  HGB 13.1  HCT 37.5  PLT 235    Basename 01/11/12 0823  NA 140  K 3.8  CL 104  CO2 26  GLUCOSE 97  BUN 24*  CREATININE 0.81  CALCIUM 9.5    Blood smear review:  n/a  Pathology:as above  US Breast Right  12/30/2011  *RADIOLOGY REPORT*  Clinical Data:  New right breast asymmetry associated with calcifications 10 o'clock position  DIGITAL DIAGNOSTIC RIGHT MAMMOGRAM  AND RIGHT BREAST ULTRASOUND:  Comparison:  Multiple prior studies including 12/21/2011, 09/05/2011, 12/07/2007  Findings:  Persistent obscured 1 cm mass 10 o'clock position right breast associated on both CC and MLO images with a few punctate calcifications.  On physical exam, there is a subtle palpable 7 mm mass 10 o'clock position right breast 7 cm from the nipple.  Ultrasound is performed, showing an irregular hypoechoic mass in the 10 o'clock position of the right breast 7 cm from the nipple. It measures 6 x 6 x 7 mm.  It demonstrates posterior acoustic shadowing and contains a few calcifications.  Scanning of the right axilla showed only normal lymph nodes.  IMPRESSION: Suspicious mass 10 o'clock position right breast, associated with microcalcifications.  BI-RADS CATEGORY 4:  Suspicious abnormality - biopsy should be considered.  Recommendation:  The patient will undergo ultrasound guided core needle biopsy of the mass today.  Original Report Authenticated By: [email or web address]   Mr Breast Bilateral W Wo Contrast  01/05/2012  *RADIOLOGY REPORT*  Clinical Data: New diagnosis right-sided breast cancer.  BILATERAL BREAST MRI WITH AND WITHOUT CONTRAST  Technique: Multiplanar, multisequence MR images of both breasts were obtained prior to and following the intravenous administration of 15ml of Multihance.  Three dimensional images were evaluated at the independent DynaCad workstation.  Comparison:  Mammogram dated 12/21/2011  Findings: Foci of nonspecific enhancement seen bilaterally.  A round, homogeneously enhancing mass with washout kinetics and circumscribed margins is seen in the upper outer quadrant of the right breast at the junction of the posterior and middle third measuring 0.8 x 0.7 x 1.1 cm.  This  corresponds to the biopsy- proven malignancy and a biopsy clip is seen in association with the mass.  No other suspicious mass or enhancement is seen in either breast.  No axillary or internal mammary adenopathy is present.  A round, T2 bright lesion is seen in the mid thoracic spine measuring 1.6 cm, likely a hemangioma.  IMPRESSION: Known malignancy, right breast.  No MRI specific evidence of malignancy, left breast.  THREE-DIMENSIONAL MR IMAGE RENDERING ON INDEPENDENT WORKSTATION:  Three-dimensional MR images were rendered by post-processing of the original MR data on an independent workstation.  The three- dimensional MR images were interpreted, and findings were reported in the accompanying complete MRI report for this study.  BI-RADS CATEGORY 6:  Known biopsy-proven malignancy - appropriate action should be taken.  Original Report Authenticated By: Hiram Gash, M.D.   [place] Core Biopsy  12/30/2011  *RADIOLOGY REPORT*  Clinical Data:  New right breast mass associated with calcification  ULTRASOUND GUIDED VACUUM ASSISTED CORE BIOPSY OF THE RIGHT BREAST  Comparison:  12/30/2011, 12/21/2011  I met with the patient and we discussed the procedure of ultrasound- guided biopsy, including benefits and alternatives.  We discussed the high likelihood of a successful procedure. We discussed the risks of the procedure, including infection, bleeding, tissue injury, clip migration, and inadequate sampling.  Informed, written consent was given.  Using sterile technique, 2% lidocaine, ultrasound guidance, and a 9 gauge vacuum assisted needle, biopsy was performed of the right breast mass.  At the conclusion of the procedure, a tissue marker clip was deployed into the biopsy cavity.  Follow-up 2-view mammogram was performed and dictated separately.  IMPRESSION: Ultrasound-guided biopsy of right breast mass with associated microcalcifications seen both mammographically and by ultrasound. No apparent complications.   Original Report Authenticated By: Valentina [employer] R  12/30/2011  *RADIOLOGY REPORT*  Clinical Data:  New right breast asymmetry associated with calcifications 10 o'clock position  DIGITAL DIAGNOSTIC RIGHT MAMMOGRAM  AND RIGHT BREAST ULTRASOUND:  Comparison:  Multiple prior studies including 12/21/2011, 09/05/2011, 12/07/2007  Findings:  Persistent obscured 1 cm mass 10 o'clock position right breast associated on both CC and MLO images with a few punctate calcifications.  On physical exam, there is a subtle palpable 7 mm mass 10 o'clock position right breast 7 cm from the nipple.  Ultrasound is performed, showing an irregular hypoechoic mass in the 10 o'clock position of the right breast 7 cm from the nipple. It measures 6 x 6 x 7 mm.  It demonstrates posterior acoustic shadowing and contains a few calcifications.  Scanning of the right axilla showed only normal lymph nodes.  IMPRESSION: Suspicious mass 10 o'clock position right breast, associated with microcalcifications.  BI-RADS CATEGORY 4:  Suspicious abnormality - biopsy should be considered.  Recommendation:  The patient will undergo ultrasound guided core needle biopsy of the mass today.  Original Report Authenticated By: [email or web address]   Mm Digital Diagnostic [employer] R  12/30/2011  *RADIOLOGY REPORT*  Clinical Data:  Right breast mass with calcifications  DIGITAL DIAGNOSTIC RIGHT MAMMOGRAM  Comparison:  12/21/2011  Findings:  Films are performed following ultrasound guided biopsy of mass in the 10 o'clock position of the right breast.  Marker clip projects over the mass within 3 mm of the associated calcifications.  IMPRESSION: Appropriate clip positioning, indicating that the calcifications and the mass are part of the same process.  The clip projects over the mass that contained calcifications seen by ultrasound.  Original Report Authenticated By: [email or web address]   Mm Digital Screening  12/22/2011  DG SCREEN MAMMOGRAM BILATERAL Bilateral CC and MLO view(s)  were taken.  DIGITAL SCREENING MAMMOGRAM WITH CAD: There are scattered fibroglandular densities.  A possible mass is noted in the right breast.  Spot  compression views and possibly sonography are recommended for further evaluation.  Microcalcifications are present in the right breast.  Characterization with magnification views is  recommended.   In the left breast, no masses or malignant type calcifications are identified.   Compared with prior studies.  Images were processed with CAD.  IMPRESSION: Possible mass and calcifications,  right breast.  Additional evaluation is indicated.  The patient  will be contacted for additional studies and a supplementary report will follow.  No specific  mammographic evidence of malignancy, left breast.  ASSESSMENT: Need additional imaging evaluation and/or prior mammograms for comparison - BI-RADS 0  Further imaging of the right breast. ,   Mm Radiologist Eval And Mgmt  01/02/2012  *RADIOLOGY REPORT*  ESTABLISHED PATIENT OFFICE VISIT -  LEVEL II [phone])  Chief Complaint:  Abnormal screening mammogram dated 12/21/2011. Ultrasound-guided core biopsy of a right breast mass was performed on 12/30/2011.  History:  The patient is status post ultrasound-guided core biopsy of the right breast.  She [facility] for pathology results and wound site check.  Exam:  The patient's wound site is clean and dry with no evidence of infection.  There is a small area of ecchymosis.  Invasive ductal carcinoma was reported histologically which corresponds well with the imaging findings.  Results of the biopsy were discussed with the patient.  She was given the informational packet from the [facility] and will be seen there on 01/11/2012. Breast MRI has been arranged.  Assessment and Plan:  Treatment planning for the right breast invasive ductal carcinoma is recommended.  The patient will be seen at the [facility] on 01/11/2012.  Original  Report Authenticated By: Littie Deeds. Judyann Munson, M.D.    Assessment and Plan:  Pleasant 71 year old woman with history of small ER/PR positive breast cancer that is HER-2 negative. She has been seen by the surgeon and radiation oncologist. The current plan is for the patient undergoes lumpectomy and sentinel lymph node evaluation. Following this I will see her in consultation again to review of pathology findings. I think in all likelihood she would not require an Oncotype test. I think she would best be served by adjuvant hormonal therapy after she has completed radiation therapy. I have left the door open to discuss this further with her when she has had her surgery. I have obtained a recent bone density test which shows a T score of -1.3 in the neck of the left femur and normal bone density in the spine and right femur. This was from 2012. As such I think she would likely doing well with an aromatase inhibitor.  45 minutes was spent with the patient half the time and patient-related counseling  Pierce Crane M.D. FRCP C.

## 2012-01-11 NOTE — Progress Notes (Signed)
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 female who is   Referred today regarding the treatment of her newly diagnosed right breast cancer. She underwent a mammogram in November for sebaceous cyst. This mass was not readily noted. In retrospect it may have been present there. At this point a mass in the right upper outer quadrant was noted measuring 2 x 6 mm. An MRI of the bilateral breasts showed the mass measured 8 x 7 x 11 mm. No abnormalities were seen in the left breast. No abnormal lymph nodes were noted. A biopsy of this mass showed a grade 1 invasive ductal carcinoma which was ER/PR positive HER-2 negative Ki 67 12%. As discussed her treatment options with Dr. Selina Cooley and would like to pursue breast conservation. She has no breast related complaints.Marland Kitchen  PREVIOUS RADIATION THERAPY: No  PAST MEDICAL HISTORY:  has a past medical history of Anxiety; Cataracts, bilateral; Hypertension; Osteopenia; and Diabetes mellitus.    PAST SURGICAL HISTORY: Past Surgical History  Procedure Date  . Vaginal hysterectomy 12/01/2008  . Tubal ligation 1974    adverse reaction to sedation  . Colonoscopy 2003 2008  . Polypectomy 2003   . Tonsillectomy 1953  . Dilation and curettage of uterus 2002    FAMILY HISTORY: family history is negative for Colon cancer.  SOCIAL HISTORY:  reports that she quit smoking about 23 years ago. Her smoking use included Cigarettes. She has a 22.5 pack-year smoking history. She has never used smokeless tobacco. She reports that she drinks alcohol. She reports that she does not use illicit drugs.  ALLERGIES:  Review of patient's allergies indicates no known allergies.  MEDICATIONS:  Current Outpatient Prescriptions  Medication Sig Dispense Refill  . calcium carbonate (OS-CAL) 600 MG TABS Take 600 mg by mouth 2 (two) times daily with a meal.      . cholecalciferol (VITAMIN D) 1000 UNITS tablet Take 1,000 Units by mouth daily.      Marland Kitchen HYDROMET 5-1.5 MG/5ML syrup       . MICARDIS HCT 40-12.5 MG per tablet       . Multiple Vitamins-Minerals (MULTIVITAMIN PO) Take 1 tablet by mouth daily.      . solifenacin (VESICARE) 5 MG tablet Take 10 mg by mouth daily.      Marland Kitchen zolpidem (AMBIEN) 5 MG tablet Take 5 mg by mouth at bedtime as needed.        REVIEW OF SYSTEMS:  A 15 point review of systems is documented in the electronic medical record. This was obtained by the nursing staff. However, I reviewed this with the patient to discuss relevant findings and make appropriate changes.  Pertinent items are noted in HPI.   PHYSICAL EXAM:  vitals were not taken for this visit.  There were no vitals taken for this visit. General appearance: alert, cooperative and appears stated age Breasts: normal appearance, no masses or tenderness, positive findings: She has a very small ecchymoses noted in the upper-outer quadrant of the right breast. No palpable abnormalities of the left breast. No palpable cervical supraclavicular or axillary adenopathy. Neurologic: Alert and oriented X 3, normal strength and tone. Normal  symmetric reflexes. Normal coordination and gait  LABORATORY DATA:  Lab Results  Component Value Date   WBC 5.3 01/11/2012   HGB 13.1 01/11/2012   HCT 37.5 01/11/2012   MCV 87.6 01/11/2012   PLT 235 01/11/2012   Lab Results  Component Value Date   NA 140 01/11/2012   K 3.8 01/11/2012   CL 104 01/11/2012   CO2 26 01/11/2012   Lab Results  Component Value Date   ALT 21 01/11/2012   AST 20 01/11/2012   ALKPHOS 52 01/11/2012   BILITOT 0.3 01/11/2012     RADIOGRAPHY: US Breast Right  12/30/2011  *RADIOLOGY REPORT*   Clinical Data:  New right breast asymmetry associated with calcifications 10 o'clock position  DIGITAL DIAGNOSTIC RIGHT MAMMOGRAM  AND RIGHT BREAST ULTRASOUND:  Comparison:  Multiple prior studies including 12/21/2011, 09/05/2011, 12/07/2007  Findings:  Persistent obscured 1 cm mass 10 o'clock position right breast associated on both CC and MLO images with a few punctate calcifications.  On physical exam, there is a subtle palpable 7 mm mass 10 o'clock position right breast 7 cm from the nipple.  Ultrasound is performed, showing an irregular hypoechoic mass in the 10 o'clock position of the right breast 7 cm from the nipple. It measures 6 x 6 x 7 mm.  It demonstrates posterior acoustic shadowing and contains a few calcifications.  Scanning of the right axilla showed only normal lymph nodes.  IMPRESSION: Suspicious mass 10 o'clock position right breast, associated with microcalcifications.  BI-RADS CATEGORY 4:  Suspicious abnormality - biopsy should be considered.  Recommendation:  The patient will undergo ultrasound guided core needle biopsy of the mass today.  Original Report Authenticated By: [email or web address]   Mr Breast Bilateral W Wo Contrast  01/05/2012  *RADIOLOGY REPORT*  Clinical Data: New diagnosis right-sided breast cancer.  BILATERAL BREAST MRI WITH AND WITHOUT CONTRAST  Technique: Multiplanar, multisequence MR images of both breasts were obtained prior to and following the intravenous administration of 15ml of Multihance.  Three dimensional images were evaluated at the independent DynaCad workstation.  Comparison:  Mammogram dated 12/21/2011  Findings: Foci of nonspecific enhancement seen bilaterally.  A round, homogeneously enhancing mass with washout kinetics and circumscribed margins is seen in the upper outer quadrant of the right breast at the junction of the posterior and middle third measuring 0.8 x 0.7 x 1.1 cm.  This corresponds to the biopsy- proven malignancy and a biopsy clip is seen in association  with the mass.  No other suspicious mass or enhancement is seen in either breast.  No axillary or internal mammary adenopathy is present.  A round, T2 bright lesion is seen in the mid thoracic spine measuring 1.6 cm, likely a hemangioma.  IMPRESSION: Known malignancy, right breast.  No MRI specific evidence of malignancy, left breast.  THREE-DIMENSIONAL MR IMAGE RENDERING ON INDEPENDENT WORKSTATION:  Three-dimensional MR images were rendered by post-processing of the original MR data on an independent workstation.  The three- dimensional MR images were interpreted, and findings were reported in the accompanying complete MRI report for this study.  BI-RADS CATEGORY 6:  Known biopsy-proven malignancy - appropriate action should be taken.  Original Report Authenticated By: Hiram Gash, M.D.   [place] Core Biopsy  12/30/2011  *RADIOLOGY REPORT*  Clinical Data:  New right breast mass associated with calcification  ULTRASOUND GUIDED VACUUM ASSISTED CORE BIOPSY OF THE RIGHT BREAST  Comparison: 12/30/2011, 12/21/2011  I met with the patient and we discussed the procedure of ultrasound- guided  biopsy, including benefits and alternatives.  We discussed the high likelihood of a successful procedure. We discussed the risks of the procedure, including infection, bleeding, tissue injury, clip migration, and inadequate sampling.  Informed, written consent was given.  Using sterile technique, 2% lidocaine, ultrasound guidance, and a 9 gauge vacuum assisted needle, biopsy was performed of the right breast mass.  At the conclusion of the procedure, a tissue marker clip was deployed into the biopsy cavity.  Follow-up 2-view mammogram was performed and dictated separately.  IMPRESSION: Ultrasound-guided biopsy of right breast mass with associated microcalcifications seen both mammographically and by ultrasound. No apparent complications.  Original Report Authenticated By: Valentina Gu [employer] R  12/30/2011  *RADIOLOGY  REPORT*  Clinical Data:  New right breast asymmetry associated with calcifications 10 o'clock position  DIGITAL DIAGNOSTIC RIGHT MAMMOGRAM  AND RIGHT BREAST ULTRASOUND:  Comparison:  Multiple prior studies including 12/21/2011, 09/05/2011, 12/07/2007  Findings:  Persistent obscured 1 cm mass 10 o'clock position right breast associated on both CC and MLO images with a few punctate calcifications.  On physical exam, there is a subtle palpable 7 mm mass 10 o'clock position right breast 7 cm from the nipple.  Ultrasound is performed, showing an irregular hypoechoic mass in the 10 o'clock position of the right breast 7 cm from the nipple. It measures 6 x 6 x 7 mm.  It demonstrates posterior acoustic shadowing and contains a few calcifications.  Scanning of the right axilla showed only normal lymph nodes.  IMPRESSION: Suspicious mass 10 o'clock position right breast, associated with microcalcifications.  BI-RADS CATEGORY 4:  Suspicious abnormality - biopsy should be considered.  Recommendation:  The patient will undergo ultrasound guided core needle biopsy of the mass today.  Original Report Authenticated By: [email or web address]   Mm Digital Diagnostic [employer] R  12/30/2011  *RADIOLOGY REPORT*  Clinical Data:  Right breast mass with calcifications  DIGITAL DIAGNOSTIC RIGHT MAMMOGRAM  Comparison:  12/21/2011  Findings:  Films are performed following ultrasound guided biopsy of mass in the 10 o'clock position of the right breast.  Marker clip projects over the mass within 3 mm of the associated calcifications.  IMPRESSION: Appropriate clip positioning, indicating that the calcifications and the mass are part of the same process.  The clip projects over the mass that contained calcifications seen by ultrasound.  Original Report Authenticated By: [email or web address]   Mm Digital Screening  12/22/2011  DG SCREEN MAMMOGRAM BILATERAL Bilateral CC and MLO view(s) were taken.  DIGITAL SCREENING MAMMOGRAM WITH CAD: There are scattered fibroglandular  densities.  A possible mass is noted in the right breast.  Spot  compression views and possibly sonography are recommended for further evaluation.  Microcalcifications are present in the right breast.  Characterization with magnification views is  recommended.   In the left breast, no masses or malignant type calcifications are identified.   Compared with prior studies.  Images were processed with CAD.  IMPRESSION: Possible mass and calcifications,  right breast.  Additional evaluation is indicated.  The patient  will be contacted for additional studies and a supplementary report will follow.  No specific  mammographic evidence of malignancy, left breast.  ASSESSMENT: Need additional imaging evaluation and/or prior mammograms for comparison - BI-RADS 0  Further imaging of the right breast. ,   Mm Radiologist Eval And Mgmt  01/02/2012  *RADIOLOGY REPORT*  ESTABLISHED PATIENT OFFICE VISIT - LEVEL II ([id number])  Chief Complaint:  Abnormal screening mammogram dated 12/21/2011. Ultrasound-guided core biopsy of  a right breast mass was performed on 12/30/2011.  History:  The patient is status post ultrasound-guided core biopsy of the right breast.  She returns to [facility] for pathology results and wound site check.  Exam:  The patient's wound site is clean and dry with no evidence of infection.  There is a small area of ecchymosis.  Invasive ductal carcinoma was reported histologically which corresponds well with the imaging findings.  Results of the biopsy were discussed with the patient.  She was given the informational packet from the [facility] and will be seen there on 01/11/2012. Breast MRI has been arranged.  Assessment and Plan:  Treatment planning for the right breast invasive ductal carcinoma is recommended.  The patient will be seen at the [facility]isciplinary Clinic on 01/11/2012.  Original Report Authenticated By: Littie Deeds. Judyann Munson, M.D.      IMPRESSION: T1N0 Invasive carcinoma  of the right breast  PLAN: Ms. Gershon Mussel is a great candidate for breast conservation. I discussed with her the role of radiation and decreasing local failure some patients do choose breast conservation. We discussed the equivalence in terms of survival between lumpectomy and radiation versus mastectomy. We discussed the standard fractionation of 6 weeks of treatment as an outpatient. We discussed possible side effects of treatment including but not limited to skin redness discomfort and lung damage. We discussed the process of simulation in the placement tattoos. We discussed the result of prospective studies indicating excellent local control and patient to receive partial breast radiation. I discussed the placement of a cavity evaluation device, CT simulation to ensure adequate position. We discussed twice a day treatment for 5 days. We discussed the possible side effects of this treatment including but not limited to infection discomfort and a hard and palpable seroma cavity. He is also met with Dr. Donnie Coffin to discuss adjuvant treatments. I will plan on seeing her back after her surgery. I did discuss with Dr. Jamey Ripa that it is optimal to have her MammoSite cavity evaluation device placed on a Monday through Wednesday to avoid having a catheter in for 2 weekends and around. I spent 60 minutes minutes face to face with the patient and more than 50% of that time was spent in counseling and/or coordination of care.   ------------------------------------------------

## 2012-01-11 NOTE — Progress Notes (Signed)
Patient ID: Joy Owens, female   DOB: 06/18/1941, 70 y.o.   MRN: [id number]  Chief Complaint  Patient presents with  . Breast Cancer    right    HPI Joy Owens is a 70 y.o. female.  An abnormality was found on a recent mammogram and a core biopsy has shown invasive ductal carcinoma, receptor positive, HER-2 negative. An MRI showed only the single lesion and appears to be about 1.1 cm in size. No axillary abnormality was noted. She was unaware of the mass. It was not seen on a mammogram about 4 months ago although in retrospect it may have been present. There are some nearby calcifications and she had an area of calcifications monitored a few years ago that appeared stable. It is unclear to me whether this in the same area or not and we'll need to review with the radiologist later. HPI  Past Medical History  Diagnosis Date  . Anxiety   . Cataracts, bilateral     left eye  . Hypertension   . Osteopenia   . Diabetes mellitus     controlled with diet    Past Surgical History  Procedure Date  . Vaginal hysterectomy 12/01/2008  . Tubal ligation 1974    adverse reaction to sedation  . Colonoscopy 2003 2008  . Polypectomy 2003   . Tonsillectomy 1953  . Dilation and curettage of uterus 2002    Family History  Problem Relation Age of Onset  . Colon cancer Neg Hx     Social History History  Substance Use Topics  . Smoking status: Former Smoker -- 1.5 packs/day for 15 years    Types: Cigarettes    Quit date: 10/10/1988  . Smokeless tobacco: Never Used  . Alcohol Use: 0.0 oz/week    5-7 Glasses of wine per week    No Known Allergies  Current Outpatient Prescriptions  Medication Sig Dispense Refill  . calcium carbonate (OS-CAL) 600 MG TABS Take 600 mg by mouth 2 (two) times daily with a meal.      . cholecalciferol (VITAMIN D) 1000 UNITS tablet Take 1,000 Units by mouth daily.      . HYDROMET 5-1.5 MG/5ML syrup       . MICARDIS HCT 40-12.5 MG per tablet       .  Multiple Vitamins-Minerals (MULTIVITAMIN PO) Take 1 tablet by mouth daily.      . solifenacin (VESICARE) 5 MG tablet Take 10 mg by mouth daily.      . zolpidem (AMBIEN) 5 MG tablet Take 5 mg by mouth at bedtime as needed.        Review of Systems Review of Systems  Constitutional: Negative for fever, chills and unexpected weight change.  HENT: Negative for hearing loss, congestion, sore throat, trouble swallowing and voice change.   Eyes: Negative for visual disturbance.  Respiratory: Negative for cough and wheezing.   Cardiovascular: Negative for chest pain, palpitations and leg swelling.  Gastrointestinal: Negative for nausea, vomiting, abdominal pain, diarrhea, constipation, blood in stool, abdominal distention and anal bleeding.  Genitourinary: Negative for hematuria, vaginal bleeding and difficulty urinating.  Musculoskeletal: Positive for arthralgias.  Skin: Negative for rash and wound.  Neurological: Negative for seizures, syncope and headaches.  Hematological: Negative for adenopathy. Does not bruise/bleed easily.  Psychiatric/Behavioral: Negative for confusion.     Physical Exam Wt Readings from Last 3 Encounters:  01/11/12 162 lb 1.6 oz (73.528 kg)  10/19/11 160 lb (72.576 kg)  10/05/11 160 lb (72.576   kg)   Temp Readings from Last 3 Encounters:  01/11/12 98.5 F (36.9 C) Oral  10/19/11 97.6 F (36.4 C) Tympanic   BP Readings from Last 3 Encounters:  01/11/12 128/83  10/19/11 113/70  05/14/10 122/72   Pulse Readings from Last 3 Encounters:  01/11/12 67  10/19/11 64  05/14/10 80    Physical Exam  Vitals reviewed. Constitutional: She is oriented to person, place, and time. She appears well-developed and well-nourished. No distress.  HENT:  Head: Normocephalic and atraumatic.  Mouth/Throat: Oropharynx is clear and moist.  Eyes: Conjunctivae and EOM are normal. Pupils are equal, round, and reactive to light. No scleral icterus.  Neck: Normal range of motion.  Neck supple. No tracheal deviation present. No thyromegaly present.  Cardiovascular: Normal rate, regular rhythm, normal heart sounds and intact distal pulses.  Exam reveals no gallop and no friction rub.   No murmur heard. Pulmonary/Chest: Effort normal and breath sounds normal. No respiratory distress. She has no wheezes. She has no rales.         Small ecchymosis as noted. No palpable masses. Breasts are otherwise normal.  Abdominal: Soft. Bowel sounds are normal. She exhibits no distension and no mass. There is no tenderness. There is no rebound and no guarding.  Musculoskeletal: Normal range of motion. She exhibits no edema and no tenderness.  Neurological: She is alert and oriented to person, place, and time.  Skin: Skin is warm and dry. No rash noted. She is not diaphoretic. No erythema.  Psychiatric: She has a normal mood and affect. Her behavior is normal. Judgment and thought content normal.    Data Reviewed I have reviewed the mammogram films and reports ultrasound reports and MRI films and reports with the radiologist. I have reviewed the pathology slides and reports with the pathologist. I plan to review the older mammogram films with the radiologist later to evaluate the area of calcifications from a few years ago.  Assessment    Invasive ductal carcinoma, right breast, upper outer quadrant, receptor positive, HER-2 negative, clinical stage I.    Plan    I will long talk with the patient and her family which they tape recorded. I have recommended that she have a lumpectomy with a needle localized technique and a sentinel node evaluation. I have explained the pathophysiology and staging of breast cancer with particular attention to her exact situation. We discussed the multidisciplinary approach to breast cancer which often includes both medical and radiation oncology consultations.  We also discussed surgical options for the treatment of breast cancer including lumpectomy and  mastectomy with possible reconstructive surgery. In addition we talked about the evaluation and management of lymph nodes including a description of sentinel lymph node biopsy and axillary dissections. We reviewed potential complications and risks including bleeding, infection, numbness,  lymphedema, and the potential need for additional surgery.  She understands that for patients who are candidate for lumpectomy or mastectomy there is an equal survival rate with either technique, but a slightly higher local recurrence rate with lumpectomy. In addition she knows that a lumpectomy usually requires postoperative radiation as part of the management of the breast cancer.  We have discussed the likely postoperative course and plans for followup.  I have given the patient some written information that reviewed all of these issues. I believe her questions are answered and that she has a good understanding of the issues.   We have also talked about options for radiation therapy and she is interested in   the MammoSite technique. I reviewed that with her as has the radiation oncologist. At the time of surgery we will plan to place a MammoSite CED.     Mathan Darroch J 01/11/2012, 11:18 AM   CC: Griffin, Elaine, MD  

## 2012-01-12 ENCOUNTER — Encounter: Payer: Self-pay | Admitting: *Deleted

## 2012-01-12 ENCOUNTER — Encounter: Payer: Self-pay | Admitting: Specialist

## 2012-01-12 ENCOUNTER — Other Ambulatory Visit (INDEPENDENT_AMBULATORY_CARE_PROVIDER_SITE_OTHER): Payer: Self-pay | Admitting: Surgery

## 2012-01-12 DIAGNOSIS — C50419 Malignant neoplasm of upper-outer quadrant of unspecified female breast: Secondary | ICD-10-CM

## 2012-01-12 NOTE — Progress Notes (Signed)
Mailed after appt letter to pt. 

## 2012-01-12 NOTE — Progress Notes (Signed)
I met patient and her son at the multi-disciplinary breast clinic on 01/11/12.  She had not completed her distress screening, however, she said she would rate her stress at a low level because she felt she had good support from family and multiple friends who had dealt with breast cancer.  I gave her the support program information, as well as I told her about Reach to Recovery.  She said she did not need a Careers information officer at this time.

## 2012-01-16 ENCOUNTER — Telehealth: Payer: Self-pay | Admitting: *Deleted

## 2012-01-16 NOTE — Telephone Encounter (Signed)
Spoke to pt concerning [facility] from 4/3.  Pt denies questions regarding dx or treatment care plan.  Confirmed surgery date and gave f/u appt date and time for Dr. Donnie Coffin.  Encourage pt to call with needs.  Received verbal understanding.  Contact information given.

## 2012-01-24 ENCOUNTER — Encounter (HOSPITAL_BASED_OUTPATIENT_CLINIC_OR_DEPARTMENT_OTHER): Payer: Self-pay | Admitting: *Deleted

## 2012-01-24 ENCOUNTER — Telehealth: Payer: Self-pay | Admitting: *Deleted

## 2012-01-24 ENCOUNTER — Encounter (HOSPITAL_BASED_OUTPATIENT_CLINIC_OR_DEPARTMENT_OTHER)
Admission: RE | Admit: 2012-01-24 | Discharge: 2012-01-24 | Disposition: A | Discharge status: Home / Self Care | Payer: Medicare Other | Source: Ambulatory Visit | Attending: Surgery | Admitting: Surgery

## 2012-01-24 DIAGNOSIS — S60419A Abrasion of unspecified finger, initial encounter: Secondary | ICD-10-CM

## 2012-01-24 HISTORY — DX: Abrasion of unspecified finger, initial encounter: S60.419A

## 2012-01-24 LAB — BASIC METABOLIC PANEL
BUN: 25 mg/dL — ABNORMAL HIGH (ref 6–23)
Chloride: 101 mEq/L (ref 96–112)
GFR calc Af Amer: 74 mL/min — ABNORMAL LOW (ref >90)
GFR calc non Af Amer: 64 mL/min — ABNORMAL LOW (ref >90)
Glucose, Bld: 148 mg/dL — ABNORMAL HIGH (ref 70–99)
Potassium: 3.7 mEq/L (ref 3.5–5.1)
Sodium: 139 mEq/L (ref 135–145)

## 2012-01-24 NOTE — Telephone Encounter (Signed)
Late entry  From 01/23/12- Spoke to pt concerning f/u appts with Dr. Michell Heinrich and Dr. Donnie Coffin.  Confirmed f/u appt with Dr. Michell Heinrich on 4/25 and Dr. Donnie Coffin on 5/2.  Pt denies further needs at this time.  Encourage pt to call with questions.  Received verbal understanding.  Contact information given.

## 2012-01-27 ENCOUNTER — Encounter (HOSPITAL_BASED_OUTPATIENT_CLINIC_OR_DEPARTMENT_OTHER): Payer: Self-pay | Admitting: *Deleted

## 2012-01-30 ENCOUNTER — Ambulatory Visit (HOSPITAL_BASED_OUTPATIENT_CLINIC_OR_DEPARTMENT_OTHER): Payer: Medicare Other | Admitting: Certified Registered Nurse Anesthetist

## 2012-01-30 ENCOUNTER — Encounter (HOSPITAL_BASED_OUTPATIENT_CLINIC_OR_DEPARTMENT_OTHER): Payer: Self-pay | Admitting: Certified Registered Nurse Anesthetist

## 2012-01-30 ENCOUNTER — Ambulatory Visit
Admission: RE | Admit: 2012-01-30 | Discharge: 2012-01-30 | Disposition: A | Discharge status: Home / Self Care | Payer: Medicare Other | Source: Ambulatory Visit | Attending: Surgery | Admitting: Surgery

## 2012-01-30 ENCOUNTER — Encounter (HOSPITAL_BASED_OUTPATIENT_CLINIC_OR_DEPARTMENT_OTHER): Payer: Self-pay | Admitting: *Deleted

## 2012-01-30 ENCOUNTER — Ambulatory Visit (HOSPITAL_COMMUNITY)
Admission: RE | Admit: 2012-01-30 | Discharge: 2012-01-30 | Disposition: A | Discharge status: Home / Self Care | Payer: Medicare Other | Source: Ambulatory Visit | Attending: Surgery | Admitting: Surgery

## 2012-01-30 ENCOUNTER — Ambulatory Visit (HOSPITAL_BASED_OUTPATIENT_CLINIC_OR_DEPARTMENT_OTHER)
Admission: RE | Admit: 2012-01-30 | Discharge: 2012-01-30 | Disposition: A | Discharge status: Home / Self Care | Payer: Medicare Other | Source: Ambulatory Visit | Attending: Surgery | Admitting: Surgery

## 2012-01-30 ENCOUNTER — Encounter (HOSPITAL_BASED_OUTPATIENT_CLINIC_OR_DEPARTMENT_OTHER)
Admission: RE | Disposition: A | Discharge status: Home / Self Care | Payer: Self-pay | Source: Ambulatory Visit | Attending: Surgery

## 2012-01-30 DIAGNOSIS — C50419 Malignant neoplasm of upper-outer quadrant of unspecified female breast: Secondary | ICD-10-CM

## 2012-01-30 DIAGNOSIS — Z01812 Encounter for preprocedural laboratory examination: Secondary | ICD-10-CM | POA: Insufficient documentation

## 2012-01-30 DIAGNOSIS — I10 Essential (primary) hypertension: Secondary | ICD-10-CM | POA: Insufficient documentation

## 2012-01-30 DIAGNOSIS — F411 Generalized anxiety disorder: Secondary | ICD-10-CM | POA: Insufficient documentation

## 2012-01-30 DIAGNOSIS — E119 Type 2 diabetes mellitus without complications: Secondary | ICD-10-CM | POA: Insufficient documentation

## 2012-01-30 DIAGNOSIS — D0590 Unspecified type of carcinoma in situ of unspecified breast: Secondary | ICD-10-CM

## 2012-01-30 DIAGNOSIS — Z01810 Encounter for preprocedural cardiovascular examination: Secondary | ICD-10-CM | POA: Insufficient documentation

## 2012-01-30 HISTORY — DX: Other seasonal allergic rhinitis: J30.2

## 2012-01-30 HISTORY — DX: Unspecified cataract: H26.9

## 2012-01-30 HISTORY — DX: Unspecified osteoarthritis, unspecified site: M19.90

## 2012-01-30 HISTORY — DX: Malignant (primary) neoplasm, unspecified: C80.1

## 2012-01-30 HISTORY — DX: Overactive bladder: N32.81

## 2012-01-30 HISTORY — DX: Abrasion of unspecified finger, initial encounter: S60.419A

## 2012-01-30 HISTORY — DX: Adverse effect of unspecified anesthetic, initial encounter: T41.45XA

## 2012-01-30 HISTORY — PX: BREAST MAMMOSITE: SHX5264

## 2012-01-30 HISTORY — PX: BREAST LUMPECTOMY: SHX2

## 2012-01-30 LAB — GLUCOSE, CAPILLARY
Glucose-Capillary: 93 mg/dL (ref 70–99)
Glucose-Capillary: 132 mg/dL — ABNORMAL HIGH (ref 70–99)

## 2012-01-30 SURGERY — BREAST LUMPECTOMY WITH NEEDLE LOCALIZATION AND AXILLARY SENTINEL LYMPH NODE BX
Anesthesia: General | Site: Breast | Laterality: Right | Wound class: Clean

## 2012-01-30 MED ORDER — METOCLOPRAMIDE HCL 5 MG/ML IJ SOLN: 10.0000 mg | Freq: Once | INTRAMUSCULAR | Status: DC | PRN

## 2012-01-30 MED ORDER — CHLORHEXIDINE GLUCONATE 4 % EX LIQD: 1.0000 "application " | Freq: Once | CUTANEOUS | Status: DC

## 2012-01-30 MED ORDER — CEFAZOLIN SODIUM 1-5 GM-% IV SOLN
1.0000 g | INTRAVENOUS | Status: AC
  Administered 2012-01-30: 1 g via INTRAVENOUS

## 2012-01-30 MED ORDER — BUPIVACAINE HCL (PF) 0.25 % IJ SOLN
INTRAMUSCULAR | Status: DC | PRN
  Administered 2012-01-30: 30 mL

## 2012-01-30 MED ORDER — HYDROMORPHONE HCL PF 1 MG/ML IJ SOLN
0.2500 mg | INTRAMUSCULAR | Status: DC | PRN
  Administered 2012-01-30: 0.25 mg via INTRAVENOUS

## 2012-01-30 MED ORDER — PROPOFOL 10 MG/ML IV EMUL
INTRAVENOUS | Status: DC | PRN
  Administered 2012-01-30: 200 mg via INTRAVENOUS

## 2012-01-30 MED ORDER — MIDAZOLAM HCL 2 MG/2ML IJ SOLN
0.5000 mg | INTRAMUSCULAR | Status: DC | PRN
  Administered 2012-01-30: 1 mg via INTRAVENOUS

## 2012-01-30 MED ORDER — SODIUM CHLORIDE 0.9 % IJ SOLN
INTRAMUSCULAR | Status: DC | PRN
  Administered 2012-01-30: 14:00:00 via INTRADERMAL

## 2012-01-30 MED ORDER — FENTANYL CITRATE 0.05 MG/ML IJ SOLN
50.0000 ug | INTRAMUSCULAR | Status: DC | PRN
  Administered 2012-01-30: 50 ug via INTRAVENOUS

## 2012-01-30 MED ORDER — TECHNETIUM TC 99M SULFUR COLLOID FILTERED
1.0000 | Freq: Once | INTRAVENOUS | Status: AC | PRN
  Administered 2012-01-30: 1 via INTRADERMAL

## 2012-01-30 MED ORDER — OXYCODONE HCL 5 MG PO TABS: 5.0000 mg | ORAL_TABLET | ORAL | Status: AC | PRN

## 2012-01-30 MED ORDER — ONDANSETRON HCL 4 MG/2ML IJ SOLN
INTRAMUSCULAR | Status: DC | PRN
  Administered 2012-01-30: 4 mg via INTRAVENOUS

## 2012-01-30 MED ORDER — ACETAMINOPHEN 10 MG/ML IV SOLN
1000.0000 mg | Freq: Once | INTRAVENOUS | Status: AC
  Administered 2012-01-30: 1000 mg via INTRAVENOUS

## 2012-01-30 MED ORDER — IOHEXOL 300 MG/ML  SOLN
INTRAMUSCULAR | Status: DC | PRN
  Administered 2012-01-30: 40 mL via INTRAVENOUS

## 2012-01-30 MED ORDER — FENTANYL CITRATE 0.05 MG/ML IJ SOLN
INTRAMUSCULAR | Status: DC | PRN
  Administered 2012-01-30: 50 ug via INTRAVENOUS
  Administered 2012-01-30 (×3): 25 ug via INTRAVENOUS

## 2012-01-30 MED ORDER — METOCLOPRAMIDE HCL 5 MG/ML IJ SOLN
INTRAMUSCULAR | Status: DC | PRN
  Administered 2012-01-30: 10 mg via INTRAVENOUS

## 2012-01-30 MED ORDER — LIDOCAINE HCL (CARDIAC) 20 MG/ML IV SOLN
INTRAVENOUS | Status: DC | PRN
  Administered 2012-01-30: 60 mg via INTRAVENOUS

## 2012-01-30 MED ORDER — MORPHINE SULFATE 4 MG/ML IJ SOLN: 0.0500 mg/kg | INTRAMUSCULAR | Status: DC | PRN

## 2012-01-30 MED ORDER — DEXAMETHASONE SODIUM PHOSPHATE 4 MG/ML IJ SOLN
INTRAMUSCULAR | Status: DC | PRN
  Administered 2012-01-30: 4 mg via INTRAVENOUS

## 2012-01-30 MED ORDER — LACTATED RINGERS IV SOLN
INTRAVENOUS | Status: DC
  Administered 2012-01-30 (×2): via INTRAVENOUS

## 2012-01-30 MED ORDER — CEPHALEXIN 500 MG PO CAPS: 500.0000 mg | ORAL_CAPSULE | Freq: Four times a day (QID) | ORAL | Status: AC

## 2012-01-30 SURGICAL SUPPLY — 78 items
ADH SKN CLS APL DERMABOND .7 (GAUZE/BANDAGES/DRESSINGS) ×2
APL SKNCLS STERI-STRIP NONHPOA (GAUZE/BANDAGES/DRESSINGS)
APPLICATOR COTTON TIP 6IN STRL (MISCELLANEOUS) IMPLANT
APPLIER CLIP 11 MED OPEN (CLIP)
APPLIER CLIP 9.375 MED OPEN (MISCELLANEOUS)
APR CLP MED 11 20 MLT OPN (CLIP)
APR CLP MED 9.3 20 MLT OPN (MISCELLANEOUS)
BANDAGE ELASTIC 6 VELCRO ST LF (GAUZE/BANDAGES/DRESSINGS) IMPLANT
BENZOIN TINCTURE PRP APPL 2/3 (GAUZE/BANDAGES/DRESSINGS) IMPLANT
BINDER BREAST XLRG (GAUZE/BANDAGES/DRESSINGS) ×1 IMPLANT
BLADE HEX COATED 2.75 (ELECTRODE) ×2 IMPLANT
BLADE SURG 15 STRL LF DISP TIS (BLADE) ×2 IMPLANT
BLADE SURG 15 STRL SS (BLADE) ×4
CANISTER SUCTION 1200CC (MISCELLANEOUS) ×2 IMPLANT
CHLORAPREP W/TINT 26ML (MISCELLANEOUS) ×2 IMPLANT
CLIP APPLIE 11 MED OPEN (CLIP) IMPLANT
CLIP APPLIE 9.375 MED OPEN (MISCELLANEOUS) IMPLANT
CLIP TI MEDIUM 6 (CLIP) IMPLANT
CLIP TI WIDE RED SMALL 6 (CLIP) ×2 IMPLANT
CLOTH BEACON ORANGE TIMEOUT ST (SAFETY) ×2 IMPLANT
COVER MAYO STAND STRL (DRAPES) ×2 IMPLANT
COVER PROBE 5X48 (MISCELLANEOUS) ×2
COVER PROBE W GEL 5X96 (DRAPES) ×2 IMPLANT
COVER TABLE BACK 60X90 (DRAPES) ×2 IMPLANT
DECANTER SPIKE VIAL GLASS SM (MISCELLANEOUS) ×1 IMPLANT
DERMABOND ADVANCED (GAUZE/BANDAGES/DRESSINGS) ×2
DERMABOND ADVANCED .7 DNX12 (GAUZE/BANDAGES/DRESSINGS) ×2 IMPLANT
DEVICE CAVITY EVALUATION 9031 (MISCELLANEOUS) ×2 IMPLANT
DEVICE DUBIN W/COMP PLATE 8390 (MISCELLANEOUS) ×1 IMPLANT
DRAIN CHANNEL 19F RND (DRAIN) IMPLANT
DRAPE LAPAROSCOPIC ABDOMINAL (DRAPES) ×2 IMPLANT
DRAPE LAPAROTOMY TRNSV 102X78 (DRAPE) ×2 IMPLANT
DRAPE UTILITY XL STRL (DRAPES) ×2 IMPLANT
ELECT BLADE 4.0 EZ CLEAN MEGAD (MISCELLANEOUS)
ELECT REM PT RETURN 9FT ADLT (ELECTROSURGICAL) ×2
ELECTRODE BLDE 4.0 EZ CLN MEGD (MISCELLANEOUS) IMPLANT
ELECTRODE REM PT RTRN 9FT ADLT (ELECTROSURGICAL) ×1 IMPLANT
EVACUATOR SILICONE 100CC (DRAIN) IMPLANT
GAUZE SPONGE 4X4 12PLY STRL LF (GAUZE/BANDAGES/DRESSINGS) ×2 IMPLANT
GLOVE BIO SURGEON STRL SZ8 (GLOVE) ×1 IMPLANT
GLOVE BIOGEL PI IND STRL 8 (GLOVE) IMPLANT
GLOVE BIOGEL PI INDICATOR 8 (GLOVE) ×1
GLOVE EUDERMIC 7 POWDERFREE (GLOVE) ×3 IMPLANT
GOWN PREVENTION PLUS XLARGE (GOWN DISPOSABLE) ×3 IMPLANT
GOWN PREVENTION PLUS XXLARGE (GOWN DISPOSABLE) ×1 IMPLANT
KIT CVR 48X5XPRB PLUP LF (MISCELLANEOUS) ×1 IMPLANT
KIT MARKER MARGIN INK (KITS) ×2 IMPLANT
NDL HYPO 25X1 1.5 SAFETY (NEEDLE) ×2 IMPLANT
NDL SAFETY ECLIPSE 18X1.5 (NEEDLE) ×1 IMPLANT
NEEDLE HYPO 18GX1.5 SHARP (NEEDLE) ×2
NEEDLE HYPO 25X1 1.5 SAFETY (NEEDLE) ×4 IMPLANT
NS IRRIG 1000ML POUR BTL (IV SOLUTION) ×2 IMPLANT
PACK BASIN DAY SURGERY FS (CUSTOM PROCEDURE TRAY) ×2 IMPLANT
PEN SKIN MARKING BROAD TIP (MISCELLANEOUS) IMPLANT
PENCIL BUTTON HOLSTER BLD 10FT (ELECTRODE) ×2 IMPLANT
PIN SAFETY STERILE (MISCELLANEOUS) IMPLANT
SLEEVE SCD COMPRESS KNEE MED (MISCELLANEOUS) ×2 IMPLANT
SPONGE GAUZE 4X4 12PLY (GAUZE/BANDAGES/DRESSINGS) IMPLANT
SPONGE INTESTINAL PEANUT (DISPOSABLE) IMPLANT
SPONGE LAP 18X18 X RAY DECT (DISPOSABLE) ×1 IMPLANT
SPONGE LAP 4X18 X RAY DECT (DISPOSABLE) ×2 IMPLANT
STAPLER VISISTAT 35W (STAPLE) ×1 IMPLANT
STRIP CLOSURE SKIN 1/2X4 (GAUZE/BANDAGES/DRESSINGS) IMPLANT
SUT ETHILON 2 0 FS 18 (SUTURE) IMPLANT
SUT ETHILON 3 0 FSL (SUTURE) IMPLANT
SUT MNCRL AB 4-0 PS2 18 (SUTURE) ×4 IMPLANT
SUT SILK 0 TIES 10X30 (SUTURE) IMPLANT
SUT VIC AB 3-0 CT1 27 (SUTURE)
SUT VIC AB 3-0 CT1 27XBRD (SUTURE) IMPLANT
SUT VIC AB 4-0 BRD 54 (SUTURE) IMPLANT
SUT VICRYL 3-0 CR8 SH (SUTURE) ×4 IMPLANT
SYR CONTROL 10ML LL (SYRINGE) ×4 IMPLANT
TAPE CLOTH SURG 4X10 WHT LF (GAUZE/BANDAGES/DRESSINGS) ×1 IMPLANT
TOWEL OR 17X24 6PK STRL BLUE (TOWEL DISPOSABLE) ×2 IMPLANT
TOWEL OR NON WOVEN STRL DISP B (DISPOSABLE) ×2 IMPLANT
TUBE CONNECTING 20X1/4 (TUBING) ×2 IMPLANT
WATER STERILE IRR 1000ML POUR (IV SOLUTION) ×1 IMPLANT
YANKAUER SUCT BULB TIP NO VENT (SUCTIONS) ×2 IMPLANT

## 2012-01-30 NOTE — Op Note (Signed)
Joy Owens Jan 26, 1941 [id number] 01/11/2012  Preoperative diagnosis: Right breast cancer, upper outer quadrant, invasive ductal, clinical stage I  Postoperative diagnosis: Same  Procedure: Needle localized right partial mastectomy with blue dye injection, axillary sentinel lymph node biopsy, and MammoSite CED placement  Surgeon: Currie Paris, MD, FACS  Assistant: None  Anesthesia: General   Clinical History and Indications: This patient recently presented with a small upper-outer quadrant right breast cancer. After discussion of alternative should proceed to lumpectomy with plans for a subtle node evaluation and possible MammoSite radiation afterwards.    Description of Procedure: I saw the patient and preoperative area and review the plans. The right breast is marked as the operative side. The mammogram localizing films were reviewed.  The patient was taken to the operating room and after satisfactory general anesthesia had been obtained a time out was performed. The area around the nipple was prepped with alcohol and injected with 5 cc of dilute methylene blue was then massaged thoroughly in. A full prep and drape was done.  Using the neoprobe, identified a hot area in the axilla and made a transverse incision over it. One slightly enlarged but soft node with high counts was removed. There was no blue dye. Further dissection revealed a second slightly enlarged but soft node that had blue dye coming into it and it was removed. There are no other enlarged nodes no other hot areas and no other blue lymph nodes noted. These were sent to pathology for touch preps and subsequently returned negative. Incision was closed with 3-0 Vicryl, 4-0 Monocryl subcuticular, at the very end of the procedure Dermabond was applied.  Attention was turned to the lobectomy. A curvilinear incision was made over the approximate tract of the guidewire. I divided about 1 cm of fatty tissue and racing plane  superiorly and inferiorly and then medially and then I certify the breast tissue down to the chest wall medially and superiorly than inferiorly and posterior staying close to the chest wall but leaving a little tissue behind. Finally the lateral attachments were divided. I was beyond the tip of the guidewire and not encountered. The specimen mammogram showed what appeared to be good positioning of the clip in the guidewire. Nevertheless I thought was perhaps a little close to the superior margin took symmetric tissue there as well as the inferior margin. All were inked for specimen orientation for the pathologist.  After the pathologist reported that the sentinel node was negative A MammoSite CED was placed through a separate stab wound placed lateral to the main incision. The actual lumpectomy went well medial to the tip of the guidewire site closed the medial portion of the breast tissue to diminish the cavity. I then closed the subcutis tissue over the MammoSite and then the skin. The MammoSite was inflated to 40 cc and I thought fill the cavity nicely. Using ultrasound it was about 9 mm from the skin of the closest margin that I could find with the ultrasound.  Patient tolerated procedure well. There no operative complications. All counts were correct.  Currie Paris, MD, FACS 01/30/2012 3:49 PM

## 2012-01-30 NOTE — Anesthesia Preprocedure Evaluation (Addendum)
Anesthesia Evaluation  Patient identified by MRN, date of birth, ID band Patient awake    Reviewed: Allergy & Precautions, H&P , NPO status , Patient's Chart, lab work & pertinent test results, reviewed documented beta blocker date and time   History of Anesthesia Complications (+) PROLONGED EMERGENCE  Airway Mallampati: II TM Distance: >3 FB Neck ROM: full    Dental   Pulmonary neg pulmonary ROS,          Cardiovascular hypertension, On Medications and On Home Beta Blockers     Neuro/Psych  Neuromuscular disease negative psych ROS   GI/Hepatic negative GI ROS, Neg liver ROS,   Endo/Other  Diabetes mellitus-  Renal/GU negative Renal ROS  negative genitourinary   Musculoskeletal   Abdominal   Peds  Hematology negative hematology ROS (+)   Anesthesia Other Findings See surgeon's H&P   Reproductive/Obstetrics negative OB ROS                           Anesthesia Physical Anesthesia Plan  ASA: II  Anesthesia Plan: General   Post-op Pain Management:    Induction: Intravenous  Airway Management Planned: LMA  Additional Equipment:   Intra-op Plan:   Post-operative Plan: Extubation in OR  Informed Consent: I have reviewed the patients History and Physical, chart, labs and discussed the procedure including the risks, benefits and alternatives for the proposed anesthesia with the patient or authorized representative who has indicated his/her understanding and acceptance.     Plan Discussed with: CRNA and Surgeon  Anesthesia Plan Comments:         Anesthesia Quick Evaluation

## 2012-01-30 NOTE — Interval H&P Note (Signed)
History and Physical Interval Note:  01/30/2012 1:22 PM  Joy Owens  has presented today for surgery, with the diagnosis of right breast cancer  The various methods of treatment have been discussed with the patient and family. After consideration of risks, benefits and other options for treatment, the patient has consented to  Procedure(s) (LRB): BREAST LUMPECTOMY WITH NEEDLE LOCALIZATION AND AXILLARY SENTINEL LYMPH NODE BX (Right) MAMMOSITE BREAST (Right) as a surgical intervention .  The patients' history has been reviewed, patient examined, no change in status, stable for surgery.  I have reviewed the patients' chart and labs.  Questions were answered to the patient's satisfaction.  The right breast is marked as the operative side.   [name] J

## 2012-01-30 NOTE — Anesthesia Postprocedure Evaluation (Signed)
Anesthesia Post Note  Patient: Joy Owens  Procedure(s) Performed: Procedure(s) (LRB): BREAST LUMPECTOMY WITH NEEDLE LOCALIZATION AND AXILLARY SENTINEL LYMPH NODE BX (Right) MAMMOSITE BREAST (Right)  Anesthesia type: General  Patient location: PACU  Post pain: Pain level controlled  Post assessment: Patient's Cardiovascular Status Stable  Last Vitals:  Filed Vitals:   01/30/12 1700  BP: 105/66  Pulse: 74  Temp:   Resp: 17    Post vital signs: Reviewed and stable  Level of consciousness: alert  Complications: No apparent anesthesia complications

## 2012-01-30 NOTE — H&P (View-Only) (Signed)
Patient ID: Joy Owens, female   DOB: [birth year], 71 y.o.   MRN: [id number]  Chief Complaint  Patient presents with  . Breast Cancer    right    HPI Joy Owens is a 71 y.o. female.  An abnormality was found on a recent mammogram and a core biopsy has shown invasive ductal carcinoma, receptor positive, HER-2 negative. An MRI showed only the single lesion and appears to be about 1.1 cm in size. No axillary abnormality was noted. She was unaware of the mass. It was not seen on a mammogram about 4 months ago although in retrospect it may have been present. There are some nearby calcifications and she had an area of calcifications monitored a few years ago that appeared stable. It is unclear to me whether this in the same area or not and we'll need to review with the radiologist later. HPI  Past Medical History  Diagnosis Date  . Anxiety   . Cataracts, bilateral     left eye  . Hypertension   . Osteopenia   . Diabetes mellitus     controlled with diet    Past Surgical History  Procedure Date  . Vaginal hysterectomy 12/01/2008  . Tubal ligation 1974    adverse reaction to sedation  . Colonoscopy 2003 2008  . Polypectomy 2003   . Tonsillectomy 1953  . Dilation and curettage of uterus 2002    Family History  Problem Relation Age of Onset  . Colon cancer Neg Hx     Social History History  Substance Use Topics  . Smoking status: Former Smoker -- 1.5 packs/day for 15 years    Types: Cigarettes    Quit date: 10/10/1988  . Smokeless tobacco: Never Used  . Alcohol Use: 0.0 oz/week    5-7 Glasses of wine per week    No Known Allergies  Current Outpatient Prescriptions  Medication Sig Dispense Refill  . calcium carbonate (OS-CAL) 600 MG TABS Take 600 mg by mouth 2 (two) times daily with a meal.      . cholecalciferol (VITAMIN D) 1000 UNITS tablet Take 1,000 Units by mouth daily.      Marland Kitchen HYDROMET 5-1.5 MG/5ML syrup       . MICARDIS HCT 40-12.5 MG per tablet       .  Multiple Vitamins-Minerals (MULTIVITAMIN PO) Take 1 tablet by mouth daily.      . solifenacin (VESICARE) 5 MG tablet Take 10 mg by mouth daily.      Marland Kitchen zolpidem (AMBIEN) 5 MG tablet Take 5 mg by mouth at bedtime as needed.        Review of Systems Review of Systems  Constitutional: Negative for fever, chills and unexpected weight change.  HENT: Negative for hearing loss, congestion, sore throat, trouble swallowing and voice change.   Eyes: Negative for visual disturbance.  Respiratory: Negative for cough and wheezing.   Cardiovascular: Negative for chest pain, palpitations and leg swelling.  Gastrointestinal: Negative for nausea, vomiting, abdominal pain, diarrhea, constipation, blood in stool, abdominal distention and anal bleeding.  Genitourinary: Negative for hematuria, vaginal bleeding and difficulty urinating.  Musculoskeletal: Positive for arthralgias.  Skin: Negative for rash and wound.  Neurological: Negative for seizures, syncope and headaches.  Hematological: Negative for adenopathy. Does not bruise/bleed easily.  Psychiatric/Behavioral: Negative for confusion.     Physical Exam Wt Readings from Last 3 Encounters:  01/11/12 162 lb 1.6 oz (73.528 kg)  10/19/11 160 lb (72.576 kg)  10/05/11 160 lb (72.576  kg)   Temp Readings from Last 3 Encounters:  01/11/12 98.5 F (36.9 C) Oral  10/19/11 97.6 F (36.4 C) Tympanic   BP Readings from Last 3 Encounters:  01/11/12 128/83  10/19/11 113/70  05/14/10 122/72   Pulse Readings from Last 3 Encounters:  01/11/12 67  10/19/11 64  05/14/10 80    Physical Exam  Vitals reviewed. Constitutional: She is oriented to person, place, and time. She appears well-developed and well-nourished. No distress.  HENT:  Head: Normocephalic and atraumatic.  Mouth/Throat: Oropharynx is clear and moist.  Eyes: Conjunctivae and EOM are normal. Pupils are equal, round, and reactive to light. No scleral icterus.  Neck: Normal range of motion.  Neck supple. No tracheal deviation present. No thyromegaly present.  Cardiovascular: Normal rate, regular rhythm, normal heart sounds and intact distal pulses.  Exam reveals no gallop and no friction rub.   No murmur heard. Pulmonary/Chest: Effort normal and breath sounds normal. No respiratory distress. She has no wheezes. She has no rales.         Small ecchymosis as noted. No palpable masses. Breasts are otherwise normal.  Abdominal: Soft. Bowel sounds are normal. She exhibits no distension and no mass. There is no tenderness. There is no rebound and no guarding.  Musculoskeletal: Normal range of motion. She exhibits no edema and no tenderness.  Neurological: She is alert and oriented to person, place, and time.  Skin: Skin is warm and dry. No rash noted. She is not diaphoretic. No erythema.  Psychiatric: She has a normal mood and affect. Her behavior is normal. Judgment and thought content normal.    Data Reviewed I have reviewed the mammogram films and reports ultrasound reports and MRI films and reports with the radiologist. I have reviewed the pathology slides and reports with the pathologist. I plan to review the older mammogram films with the radiologist later to evaluate the area of calcifications from a few years ago.  Assessment    Invasive ductal carcinoma, right breast, upper outer quadrant, receptor positive, HER-2 negative, clinical stage I.    Plan    I will long talk with the patient and her family which they tape recorded. I have recommended that she have a lumpectomy with a needle localized technique and a sentinel node evaluation. I have explained the pathophysiology and staging of breast cancer with particular attention to her exact situation. We discussed the multidisciplinary approach to breast cancer which often includes both medical and radiation oncology consultations.  We also discussed surgical options for the treatment of breast cancer including lumpectomy and  mastectomy with possible reconstructive surgery. In addition we talked about the evaluation and management of lymph nodes including a description of sentinel lymph node biopsy and axillary dissections. We reviewed potential complications and risks including bleeding, infection, numbness,  lymphedema, and the potential need for additional surgery.  She understands that for patients who are candidate for lumpectomy or mastectomy there is an equal survival rate with either technique, but a slightly higher local recurrence rate with lumpectomy. In addition she knows that a lumpectomy usually requires postoperative radiation as part of the management of the breast cancer.  We have discussed the likely postoperative course and plans for followup.  I have given the patient some written information that reviewed all of these issues. I believe her questions are answered and that she has a good understanding of the issues.   We have also talked about options for radiation therapy and she is interested in  the MammoSite technique. I reviewed that with her as has the radiation oncologist. At the time of surgery we will plan to place a MammoSite CED.     Iyania Denne J 01/11/2012, 11:18 AM   CC: Maurice Small, MD

## 2012-01-30 NOTE — Transfer of Care (Signed)
Immediate Anesthesia Transfer of Care Note  Patient: Joy Owens  Procedure(s) Performed: Procedure(s) (LRB): BREAST LUMPECTOMY WITH NEEDLE LOCALIZATION AND AXILLARY SENTINEL LYMPH NODE BX (Right) MAMMOSITE BREAST (Right)  Patient Location: PACU  Anesthesia Type: General  Level of Consciousness: awake, alert , oriented and patient cooperative  Airway & Oxygen Therapy: Patient Spontanous Breathing and Patient connected to face mask oxygen  Post-op Assessment: Report given to PACU RN and Post -op Vital signs reviewed and stable  Post vital signs: Reviewed and stable  Complications: No apparent anesthesia complications

## 2012-01-30 NOTE — Discharge Instructions (Signed)
[facility] °[phone] ° ° °BREAST BIOPSY/ PARTIAL MASTECTOMY: POST OP INSTRUCTIONS ° °Always review your discharge instruction sheet given to you by the facility where your surgery was performed. ° °IF YOU HAVE DISABILITY OR FAMILY LEAVE FORMS, YOU MUST BRING THEM TO THE OFFICE FOR PROCESSING.  DO NOT GIVE THEM TO YOUR DOCTOR. ° °1. A prescription for pain medication will be given to you upon discharge.  Take your pain medication as prescribed, as needed.  If narcotic pain medicine is not needed, then you may take ibuprofen (Advil) as needed. °2. Take your usually prescribed medications unless otherwise directed °3. If you need a refill on your pain medication, please contact your pharmacy.  They will contact our office to request authorization.  Prescriptions will not be filled after 5pm or on week-ends. °4. You should eat very light the first 24 hours after surgery, such as soup, crackers, pudding, etc.  Resume your normal diet the day after surgery. °5. Most patients will experience some swelling and bruising in the breast.  Ice packs and a good support bra will help.  Swelling and bruising can take several days to resolve.  °6. It is common to experience some constipation if taking pain medication after surgery.  Increasing fluid intake and taking a stool softener will usually help or prevent this problem from occurring.  A mild laxative (Milk of Magnesia or Miralax) should be taken according to package directions if there are no bowel movements after 48 hours. °7. Unless discharge instructions indicate otherwise, you may remove your bandages 24 hours after surgery, and you may shower at that time.  If your surgeon used skin glue on the incision, you may shower in 24 hours.  The glue will flake off over the next 2-3 weeks. °8. DRAINS:  If you have drain, it is important to keep a list of the amount of drainage produced each day in your drains.  Before leaving the hospital, you should  be instructed on drain care.  Call our office if you have any questions about your drains. BE SURE TO BRING THE RECORD OF THE AMOUNT OF DRAINAGE TO YOUR OFFICE VISITS. We use this to determine when the drains can be removed. °9. ACTIVITIES:  You may resume regular daily activities (gradually increasing) beginning the next day.  Wearing a good support bra or sports bra minimizes pain and swelling.  You may have sexual intercourse when it is comfortable. °a. You may drive when you no longer are taking prescription pain medication, you can comfortably wear a seatbelt, and you can safely maneuver your car and apply brakes. °b. RETURN TO WORK:  ______________________________________________________________________________________ °10. You should see your doctor in the office for a follow-up appointment approximately two weeks after your surgery.  Your doctor’s nurse will typically make your follow-up appointment when she calls you with your pathology report.  Expect your pathology report 2-3 business days after your surgery.  You may call to check if you do not hear from us after three days. °11. OTHER INSTRUCTIONS: _______________________________________________________________________________________________ _____________________________________________________________________________________________________________________________________ °_____________________________________________________________________________________________________________________________________ °_____________________________________________________________________________________________________________________________________ ° °WHEN TO CALL YOUR DOCTOR: °1. Fever over 101.0 °2. Nausea and/or vomiting. °3. Extreme swelling or bruising. °4. Continued bleeding from incision. °5. Increased pain, redness, or drainage from the incision. ° °The clinic staff is available to answer your questions during regular business hours.  Please don’t  hesitate to call and ask to speak to one of the nurses for clinical concerns.  If you have a medical emergency, go   to the nearest emergency room or call 911.  A surgeon from [facility] is always on call at the hospital.  [address], [address], [place], [place]  [address] ?  [address] [address], [place], [place]   [address]                           [phone] ? [phone] ? FAX [phone] Web site: [email or web address]  Post Anesthesia Home Care Instructions  Activity: Get plenty of rest for the remainder of the day. A responsible adult should stay with you for 24 hours following the procedure.  For the next 24 hours, DO NOT: -Drive a car -Advertising copywriter -Drink alcoholic beverages -Take any medication unless instructed by your physician -Make any legal decisions or sign important papers.  Meals: Start with liquid foods such as gelatin or soup. Progress to regular foods as tolerated. Avoid greasy, spicy, heavy foods. If nausea and/or vomiting occur, drink only clear liquids until the nausea and/or vomiting subsides. Call your physician if vomiting continues.  Special Instructions/Symptoms: Your throat may feel dry or sore from the anesthesia or the breathing tube placed in your throat during surgery. If this causes discomfort, gargle with warm salt water. The discomfort should disappear within 24 hours.    Call your surgeon if you experience:   1.  Fever over 101.0. 2.  Inability to urinate. 3.  Nausea and/or vomiting. 4.  Extreme swelling or bruising at the surgical site. 5.  Continued bleeding from the incision. 6.  Increased pain, redness or drainage from the incision. 7.  Problems related to your pain medication.

## 2012-01-30 NOTE — Anesthesia Procedure Notes (Signed)
Procedure Name: LMA Insertion Date/Time: 01/30/2012 2:12 PM Performed by: Catricia Scheerer D Pre-anesthesia Checklist: Patient identified, Emergency Drugs available, Suction available and Patient being monitored Patient Re-evaluated:Patient Re-evaluated prior to inductionOxygen Delivery Method: Circle System Utilized Preoxygenation: Pre-oxygenation with 100% oxygen Intubation Type: IV induction Ventilation: Mask ventilation without difficulty LMA: LMA inserted LMA Size: 4.0 Number of attempts: 1 Placement Confirmation: positive ETCO2 Tube secured with: Tape Dental Injury: Teeth and Oropharynx as per pre-operative assessment

## 2012-01-31 ENCOUNTER — Ambulatory Visit (INDEPENDENT_AMBULATORY_CARE_PROVIDER_SITE_OTHER): Payer: Medicare Other | Admitting: Surgery

## 2012-01-31 ENCOUNTER — Encounter (HOSPITAL_BASED_OUTPATIENT_CLINIC_OR_DEPARTMENT_OTHER): Payer: Self-pay | Admitting: Surgery

## 2012-01-31 VITALS — BP 106/68 | HR 64 | Temp 98.2°F | Resp 12 | Ht 62.5 in | Wt 164.0 lb

## 2012-01-31 DIAGNOSIS — Z9889 Other specified postprocedural states: Secondary | ICD-10-CM

## 2012-01-31 DIAGNOSIS — C50419 Malignant neoplasm of upper-outer quadrant of unspecified female breast: Secondary | ICD-10-CM

## 2012-01-31 NOTE — Progress Notes (Signed)
Chief complaint bleeding from incision  History of present illness: This patient underwent a needle guided lumpectomy and placement of a MammoSite CED yesterday. This had a lot of bloody drainage overnight and call the office today so I asked her to come in for a visit. She is not having much pain.  Exam: Vital signs: Wound: The incision is healing nicely. There is no evidence of infection. The drainage is serosanguineous. There is not appear to be hematoma present. Fluid was redressed with some antibiotic ointment on the MammoSite CED. She'll come back Thursday to have this replaced assuming pathology is appropriate and the plan CT of the breast tomorrow is okay.

## 2012-01-31 NOTE — Patient Instructions (Signed)
I will see you again on Thursday after we have the results of the pathology

## 2012-02-01 ENCOUNTER — Encounter (INDEPENDENT_AMBULATORY_CARE_PROVIDER_SITE_OTHER): Payer: Self-pay | Admitting: Surgery

## 2012-02-01 ENCOUNTER — Ambulatory Visit
Admission: RE | Admit: 2012-02-01 | Discharge: 2012-02-01 | Disposition: A | Discharge status: Home / Self Care | Payer: Medicare Other | Source: Ambulatory Visit | Attending: Radiation Oncology | Admitting: Radiation Oncology

## 2012-02-01 DIAGNOSIS — Z510 Encounter for antineoplastic radiation therapy: Secondary | ICD-10-CM | POA: Insufficient documentation

## 2012-02-01 DIAGNOSIS — C50419 Malignant neoplasm of upper-outer quadrant of unspecified female breast: Secondary | ICD-10-CM

## 2012-02-01 NOTE — Progress Notes (Signed)
MAMMOSITE DRESSING APPLIED PER PROTOCOL.  SITE CLEAR WITH SMALL AMT OF SEROSANGUINESS DRAINAGE.  CLEANED WITH STERILE WATER, APPLIED NEOSPORIN, 2, 4X4'S AND ABD TO COVER THESE, HELD IN PLACE WITH MESH TOP.  PATIENT TOLERATED WELL.

## 2012-02-02 ENCOUNTER — Telehealth (INDEPENDENT_AMBULATORY_CARE_PROVIDER_SITE_OTHER): Payer: Self-pay

## 2012-02-02 ENCOUNTER — Ambulatory Visit: Payer: Medicare Other

## 2012-02-02 ENCOUNTER — Encounter (INDEPENDENT_AMBULATORY_CARE_PROVIDER_SITE_OTHER): Payer: Medicare Other | Admitting: Surgery

## 2012-02-02 ENCOUNTER — Ambulatory Visit: Payer: Medicare Other | Admitting: Radiation Oncology

## 2012-02-02 NOTE — Telephone Encounter (Signed)
Pt returned my call about the change of the appt. I advised pt that we will do the placement mammosite on Friday am at 8:30am and the pt will go to [facility] radiation directly after her appt from [place]. The pt understands.

## 2012-02-02 NOTE — Progress Notes (Addendum)
Name: Joy Owens   MRN: [id number]  Date:  02/02/2012   DOB: December 16, 1940  Status:outpatient    DIAGNOSIS: Breast cancer.  CONSENT VERIFIED: yes   SET UP: Patient is setup supine  Prior to simulation, I verified that her final pathology had negative margins and her lymph nodes were negative.   [name] was then brought to the CT simulator.  She was scanned and the MammoSite balloon was auto-contoured, expansions of 1, 1.2 and 1.3 cm way beyond the balloon surface.  The skin to surface distance was excellent.  The PTV expansion did not protrude through the skin surface.  There was excellent balloon conformality within the lumpectomy cavity without any significant seroma packets around the balloon.  The volume of the contour balloon closely matched that which was documented in the operating room.  Her minimal distance to the skin was 1.3 cm, minimum dose to the chest wall was 2.5 cm.  The air volume was 0.5 cc.   I contacted Dr. Tenna Child office and he will place a balloon tomorrow.  She will return Friday for treatment planning and will start her treatment on Monday.

## 2012-02-02 NOTE — Telephone Encounter (Signed)
LMOM for pt to call me back about her appt this pm has been changed.

## 2012-02-03 ENCOUNTER — Ambulatory Visit: Payer: Medicare Other | Admitting: Radiation Oncology

## 2012-02-03 ENCOUNTER — Ambulatory Visit (INDEPENDENT_AMBULATORY_CARE_PROVIDER_SITE_OTHER): Payer: Medicare Other | Admitting: Surgery

## 2012-02-03 ENCOUNTER — Ambulatory Visit: Payer: Medicare Other

## 2012-02-03 DIAGNOSIS — C50419 Malignant neoplasm of upper-outer quadrant of unspecified female breast: Secondary | ICD-10-CM

## 2012-02-03 NOTE — Progress Notes (Signed)
Chief complaint: MammoSite placement History of present illness: This patient underwent lumpectomy and sentinel node a few days ago. A MammoSite CED was placed at that time. We placed it looks appropriate and her pathology shows negative margins and negative lymph nodes. We discussed placement of the MammoSite device and the patient wished to proceed.  The breast area was prepped with alcohol and draped. We confirmed the plans for the procedure. The new MammoSite was tested and the balloon appeared symmetric. The MammoSite CED was deflated and removed and immediately placed by the MammoSite device. This went easily. The fluid from the MammoSite CED was then placed into the MammoSite with a total volume being 37 cc. Ultrasound showed good performance to the upper half of the cavity. The procedure well and there were no complications.

## 2012-02-06 ENCOUNTER — Telehealth: Payer: Self-pay | Admitting: *Deleted

## 2012-02-06 ENCOUNTER — Ambulatory Visit
Admission: RE | Admit: 2012-02-06 | Discharge: 2012-02-06 | Disposition: A | Discharge status: Home / Self Care | Payer: Medicare Other | Source: Ambulatory Visit | Attending: Radiation Oncology | Admitting: Radiation Oncology

## 2012-02-06 DIAGNOSIS — C50419 Malignant neoplasm of upper-outer quadrant of unspecified female breast: Secondary | ICD-10-CM

## 2012-02-06 NOTE — Progress Notes (Signed)
Received patient in the clinic following HDR treatment. Patient is alert and oriented to person, place, and time. No distress noted. Steady gait noted. Pleasant affect noted. Cleansed around catheter site with sterile saline then applied triple action antibiotic. Applied drain dressing around catheter reinforced with 4x4 gauze and ABD pad. All secured with stretch mesh. Patient tolerated well. Escorted patient to lobby for discharge.   

## 2012-02-06 NOTE — Progress Notes (Signed)
Met with patient to discuss RO billing.  Patient inquired about research programs that would help pay for her treatments.  Will call someone in research department to find out.  Went over the other services provided at the [place] support center and took patient to 2nd floor where she discussed services with [employer].  Patient had no other major concerns today.

## 2012-02-06 NOTE — Progress Notes (Signed)
[facility] Radiation Oncology HDR Breast Brachytherapy Procedure Note   Name: MARAYA GWILLIAM MRN: [id number]  Date:   02/06/2012           DOB: 07-05-1941  Status:outpatient   WU:[id number] Thomasena Edis, MD, MD  Jamey Ripa, Reola Mosher, MD     DIAGNOSIS: The encounter diagnosis was Cancer of upper-outer quadrant of female breast.    PROCEDURE:  High Dose Rate MammoSite Brachytherapy     CURRENT FRACTION:1    PLANNED FRACTION: 10   COMPLEX SIMULATION VERIFICATION: Renarda Mullinix Cregger was brought to the CT planning suite and simulation verification was performed to verify the balloon symmetry and inflation.  I reviewed these films and I approved treatment.   NARRATIVE: She was brought to the HDR treatment suite.  Under my direct supervision, the afterloading device was attached to the single lumen MammoSite catheter and the Iridium-192 source was delivered remotely to 5 dwell positions.  The planned treatment was completed.  Following the treatment, a survey of the room showed no persistent radioactivity.  No complications occurred during the course of therapy.   DOSIMETRY:  In order to deliver the fractional dose of 340 Gy, the dwell time was calculated based on the current source activity.  The total dwell time was 408.7 seconds. Cumulative dose to date is 340 cGy in one session of a prescribed 3400 cGy in 10 sessions.  PLAN:  The patient will return for fraction #2 this afternoon.

## 2012-02-06 NOTE — Telephone Encounter (Signed)
moved patient appointment to 02-16-2012 starting at 1:45pm gave the information to the patient's husband asked that the patient please call me and confirm she did receive the message

## 2012-02-06 NOTE — Progress Notes (Signed)
Simulation/treatment planning note:   The patient was taken to the CT simulator. A CT scan was performed of her right breast and the single-lumen MammoSite brachytherapy catheter was identified. The MammoSite balloon was in excellent position, and more than adequate distance away from her skin surface and chest wall. There was excellent conformity and symmetry. She underwent treatment planning to deliver 3400 cGy in 10 fractions to the planning target volume, representing the MammoSite balloon +1.0 cm. We met our target and avoidance goals without difficulty. She will be treated to 5  dwell positions.

## 2012-02-06 NOTE — Progress Notes (Signed)
Encounter addended by: Maryln Gottron, MD on: 02/06/2012 12:33 PM<BR>     Documentation filed: Visit Diagnoses, Notes Section

## 2012-02-06 NOTE — Progress Notes (Signed)
pataient gave name and date of birth as identifier, here for mammosite dressing change, right breast, site, clean, no redness or warmth at incision site on breast, cleansed area with normal saline, patient was asked if any allergy to sulfa,patient stated"No", then placed neosporin around incision site, no drainage noted or seen, 4x4 drain spongwe x 2 placed under and over catheter, then 4x4 gause  Placed over that, over that, then abd pad placed on top,assisted with mesh tube top and patient had pink elastic top assited,pataient tolerated well, no c/o pain, walked with patient to see Tara,finacical advisor 8:40 AM

## 2012-02-06 NOTE — Progress Notes (Signed)
[facility] Radiation Oncology HDR Breast Brachytherapy Procedure Note   Name: Joy Owens MRN: [id number]  Date:   02/06/2012           DOB: [birth year]  Status:outpatient    DIAGNOSIS: The encounter diagnosis was Cancer of upper-outer quadrant of female breast.    PROCEDURE:  High Dose Rate MammoSite Brachytherapy     CURRENT FRACTION:2    PLANNED FRACTION: 10   SIMULATION VERIFICATION: Joy Owens was brought to the CT planning suite and simulation verification was performed to verify the balloon symmetry and inflation.  I reviewed these films and I approved treatment.   NARRATIVE: She was brought to the HDR treatment suite.  Under my direct supervision, the afterloading device was attached to the single lumen MammoSite catheter and the Iridium-192 source was delivered remotely to 5 dwell positions.  The planned treatment was completed.  Following the treatment, a survey of the room showed no persistent radioactivity.  No complications occurred during the course of therapy.   DOSIMETRY:  In order to deliver the fractional dose of 340 Gy, the dwell time was calculated based on the current source activity.  The total dwell time was 409.9 seconds. Cumulative dose today is 680 cGy in 2 sessions.  PLAN:  Continue radiation therapy as planned.

## 2012-02-07 ENCOUNTER — Ambulatory Visit
Admission: RE | Admit: 2012-02-07 | Discharge: 2012-02-07 | Disposition: A | Discharge status: Home / Self Care | Payer: Medicare Other | Source: Ambulatory Visit | Attending: Radiation Oncology | Admitting: Radiation Oncology

## 2012-02-07 DIAGNOSIS — C50419 Malignant neoplasm of upper-outer quadrant of unspecified female breast: Secondary | ICD-10-CM

## 2012-02-07 NOTE — Progress Notes (Signed)
Completed Mammosite treatment.  Insertion site without any redness nor drainage.  No complaints of pain.  Site cleansed with Sterile NS, Neosporin applied, and redressed with Darin sponges, 4 x 4's and ABD pain.  Secured with Stretch netting and corset.  Will return in the am for treatment.

## 2012-02-07 NOTE — Progress Notes (Signed)
Encounter addended by: Delynn Flavin, RN on: 02/07/2012  5:53 PM<BR>     Documentation filed: Notes Section

## 2012-02-07 NOTE — Progress Notes (Addendum)
[facility] Radiation Oncology HDR Breast Brachytherapy Procedure Note   Name: Joy Owens MRN: [id number]  Date:   02/07/2012           DOB: October 03, 1941  Status:outpatient    DIAGNOSIS:  Right breast cancer  PROCEDURE:  High Dose Rate MammoSite Brachytherapy     CURRENT FRACTION:3    PLANNED FRACTION: 10   SIMULATION VERIFICATION: [name] was brought to the CT planning suite and simulation verification was performed to verify the balloon symmetry and inflation.  I reviewed these films and I approved treatment.   NARRATIVE: She was brought to the HDR treatment suite.  Under my direct supervision, the afterloading device was attached to the single lumen MammoSite catheter and the Iridium-192 source was delivered remotely to 5 dwell positions.  The planned treatment was completed.  Following the treatment, a survey of the room showed no persistent radioactivity.  No complications occurred during the course of therapy.   DOSIMETRY:  In order to deliver the fractional dose of 340 Gy, the dwell time was calculated based on the current source activity.    PLAN:  Continue radiation therapy as planned.

## 2012-02-07 NOTE — Progress Notes (Signed)
[facility] Radiation Oncology HDR Breast Brachytherapy Procedure Note   Name: Joy Owens MRN: [id number]  Date:   02/07/2012           DOB: 1941/02/19  Status:outpatient    DIAGNOSIS:  Right breast cancer  PROCEDURE:  High Dose Rate MammoSite Brachytherapy     CURRENT FRACTION: 4   PLANNED FRACTION: 10   SIMULATION VERIFICATION: Jalicia Roszak Tuckerman was brought to the CT planning suite and simulation verification was performed to verify the balloon symmetry and inflation.  I reviewed these films and I approved treatment.   NARRATIVE: She was brought to the HDR treatment suite.  Under my direct supervision, the afterloading device was attached to the single lumen MammoSite catheter and the Iridium-192 source was delivered remotely to 5 dwell positions.  The planned treatment was completed.  Following the treatment, a survey of the room showed no persistent radioactivity.  No complications occurred during the course of therapy.   DOSIMETRY:  In order to deliver the fractional dose of 340 Gy, the dwell time was calculated based on the current source activity (413 seconds).    PLAN:  Continue radiation therapy as planned.

## 2012-02-07 NOTE — Progress Notes (Signed)
Encounter addended by: Lurline Hare, MD on: 02/07/2012  3:31 PM<BR>     Documentation filed: Notes Section

## 2012-02-07 NOTE — Progress Notes (Signed)
Weekly Management Note Current Dose:  10.2 Gy  Projected Dose: 34 Gy   Narrative:  The patient presents for routine under treatment assessment.  CBCT/MVCT images/Port film x-rays were reviewed.  The chart was checked. Doing well. No complaints.   Physical Findings: Catheter in place with no signs of infection  Impression:  The patient is tolerating radiation.  Plan:  Continue treatment as planned.

## 2012-02-07 NOTE — Progress Notes (Signed)
Received patient in the clinic following HDR treatment. Patient is alert and oriented to person, place, and time. No distress noted. Steady gait noted. Pleasant affect noted. Cleansed around catheter site with sterile saline then applied triple action antibiotic. Applied drain dressing around catheter reinforced with 4x4 gauze and ABD pad. All secured with stretch mesh. Patient tolerated well. Escorted patient to lobby for discharge.   

## 2012-02-08 ENCOUNTER — Ambulatory Visit
Admission: RE | Admit: 2012-02-08 | Discharge: 2012-02-08 | Disposition: A | Discharge status: Home / Self Care | Payer: Medicare Other | Source: Ambulatory Visit | Attending: Radiation Oncology | Admitting: Radiation Oncology

## 2012-02-08 DIAGNOSIS — C50419 Malignant neoplasm of upper-outer quadrant of unspecified female breast: Secondary | ICD-10-CM

## 2012-02-08 NOTE — Progress Notes (Signed)
Received patient in the clinic following HDR treatment. Patient is alert and oriented to person, place, and time. No distress noted. Steady gait noted. Pleasant affect noted. Cleansed around catheter site with sterile saline then applied triple action antibiotic. Applied drain dressing around catheter reinforced with 4x4 gauze and ABD pad. All secured with stretch mesh. Patient tolerated well. Escorted patient to lobby for discharge.   

## 2012-02-08 NOTE — Progress Notes (Signed)
[facility] Radiation Oncology HDR Breast Brachytherapy Procedure Note   Name: Joy Owens MRN: [id number]  Date:   02/08/2012           DOB: 1941/06/06  Status:outpatient    DIAGNOSIS:  Right breast cancer  PROCEDURE:  High Dose Rate MammoSite Brachytherapy     CURRENT FRACTION: 5   PLANNED FRACTION: 10   SIMULATION VERIFICATION: Tinya Cadogan Carraway was brought to the CT planning suite and simulation verification was performed to verify the balloon symmetry and inflation.  I reviewed these films and I approved treatment.   NARRATIVE: She was brought to the HDR treatment suite.  Under my direct supervision, the afterloading device was attached to the single lumen MammoSite catheter and the Iridium-192 source was delivered remotely to 5 dwell positions.  The planned treatment was completed.  Following the treatment, a survey of the room showed no persistent radioactivity.  No complications occurred during the course of therapy.   DOSIMETRY:  In order to deliver the fractional dose of 340 Gy, the dwell time was calculated based on the current source activity (416.1 seconds).    PLAN:  Continue radiation therapy as planned.

## 2012-02-08 NOTE — Progress Notes (Signed)
Encounter addended by: Agnes Lawrence, RN on: 02/08/2012  9:39 AM<BR>     Documentation filed: Notes Section

## 2012-02-08 NOTE — Progress Notes (Signed)
Encounter addended by: Agnes Lawrence, RN on: 02/08/2012  5:04 PM<BR>     Documentation filed: Notes Section

## 2012-02-08 NOTE — Progress Notes (Signed)
[facility] Radiation Oncology HDR Breast Brachytherapy Procedure Note   Name: Joy Owens MRN: [id number]  Date:   02/08/2012           DOB: 07-Mar-1941  Status:outpatient    DIAGNOSIS:  Right breast cancer  PROCEDURE:  High Dose Rate MammoSite Brachytherapy     CURRENT FRACTION: 6   PLANNED FRACTION: 10   SIMULATION VERIFICATION: Cayden Rautio Everson was brought to the CT planning suite and simulation verification was performed to verify the balloon symmetry and inflation.  I reviewed these films and I approved treatment.   NARRATIVE: She was brought to the HDR treatment suite.  Under my direct supervision, the afterloading device was attached to the single lumen MammoSite catheter and the Iridium-192 source was delivered remotely to 5 dwell positions.  The planned treatment was completed.  Following the treatment, a survey of the room showed no persistent radioactivity.  No complications occurred during the course of therapy.   DOSIMETRY:  In order to deliver the fractional dose of 340 Gy, the dwell time was calculated based on the current source activity (417.2 seconds).    PLAN:  Continue radiation therapy as planned.

## 2012-02-08 NOTE — Progress Notes (Signed)
Received patient in the clinic following HDR treatment. Patient is alert and oriented to person, place, and time. No distress noted. Steady gait noted. Pleasant affect noted. Cleansed around catheter site with sterile saline then applied triple action antibiotic. Applied drain dressing around catheter reinforced with 4x4 gauze and ABD pad. All secured with stretch mesh. Patient tolerated well. Escorted patient to lobby for discharge.

## 2012-02-09 ENCOUNTER — Ambulatory Visit: Payer: Medicare Other | Admitting: Oncology

## 2012-02-09 ENCOUNTER — Ambulatory Visit
Admission: RE | Admit: 2012-02-09 | Discharge: 2012-02-09 | Disposition: A | Discharge status: Home / Self Care | Payer: Medicare Other | Source: Ambulatory Visit | Attending: Radiation Oncology | Admitting: Radiation Oncology

## 2012-02-09 ENCOUNTER — Other Ambulatory Visit: Payer: Medicare Other | Admitting: Lab

## 2012-02-09 DIAGNOSIS — C50419 Malignant neoplasm of upper-outer quadrant of unspecified female breast: Secondary | ICD-10-CM

## 2012-02-09 NOTE — Progress Notes (Addendum)
   Department of Radiation Oncology  Phone:  [phone] Fax:        [phone] ________________________________  Weston Brass Brzoska MRN: [id number]  Date: 02/09/2012  DOB: Jan 30, 1941  HDR MammoSite Brachytherapy Procedure Note   DIAGNOSIS: Breast cancer.   SITE: . Right breast  CURRENT FRACTION: 8  PLANNED FRACTION: 10   SIMULATION VERIFICATION: An image was obtained to verify device position and geometry. The balloon symmetry was verified. The balloon inflation was checked. After careful review, I approved the simulation film.   NARRATIVE: All involved HDR brachytherapy afterloader catheters were connected to the MammoSite device. All catheter numbers and catheter connections were checked by physics and me. The brachytherapy treatment was delivered under my personal supervision uneventfully. Then the transfer tubing was disconnected and the cap placed on the catheter.   DOSIMETRY: Based on the current source strength, 421.2 seconds was calculated to deliver the planned dose of 3.4 Gy.   DISPOSITION: Treatment was delivered uneventfully. Survey of the room after treatment confirmed that the radioactive source returned to the afterloader. The dressing was re-applied. The patient tolerated treatment well and will return as scheduled for their next fraction and/or follow up visit.    ________________________________

## 2012-02-09 NOTE — Progress Notes (Signed)
Received patient in the clinic following HDR treatment. Patient is alert and oriented to person, place, and time. No distress noted. Steady gait noted. Pleasant affect noted. Cleansed around catheter site with sterile saline then applied triple action antibiotic. Applied drain dressing around catheter reinforced with 4x4 gauze and ABD pad. All secured with stretch mesh. Patient tolerated well. Escorted patient to lobby for discharge.   

## 2012-02-09 NOTE — Progress Notes (Signed)
   Department of Radiation Oncology  Phone:  [phone] Fax:        [phone] ________________________________  Weston Brass [place] MRN: [id number]  Date: 02/09/2012  DOB: 1941-08-18  HDR MammoSite Brachytherapy Procedure Note   DIAGNOSIS: Breast cancer.   SITE: . Right breast  CURRENT FRACTION: . 7  PLANNED FRACTION: 10   SIMULATION VERIFICATION: An image was obtained to verify device position and geometry. The balloon symmetry was verified. The balloon inflation was checked. After careful review, I approved the simulation film.   NARRATIVE: All involved HDR brachytherapy afterloader catheters were connected to the MammoSite device. All catheter numbers and catheter connections were checked by physics and me. The brachytherapy treatment was delivered under my personal supervision uneventfully. Then the transfer tubing was disconnected and the cap placed on the catheter.   DOSIMETRY: Based on the current source strength, 420.1 seconds was calculated to deliver the planned dose of 3.4 Gy.   DISPOSITION: Treatment was delivered uneventfully. Survey of the room after treatment confirmed that the radioactive source returned to the afterloader. The dressing was re-applied. The patient tolerated treatment well and will return as scheduled for their next fraction and/or follow up visit.    ________________________________   Radene Gunning, MD, PhD

## 2012-02-09 NOTE — Progress Notes (Signed)
Encounter addended by: Agnes Lawrence, RN on: 02/09/2012  1:20 PM<BR>     Documentation filed: Notes Section

## 2012-02-10 ENCOUNTER — Encounter: Payer: Self-pay | Admitting: Radiation Oncology

## 2012-02-10 ENCOUNTER — Ambulatory Visit
Admission: RE | Admit: 2012-02-10 | Discharge: 2012-02-10 | Disposition: A | Discharge status: Home / Self Care | Payer: Medicare Other | Source: Ambulatory Visit | Attending: Radiation Oncology | Admitting: Radiation Oncology

## 2012-02-10 DIAGNOSIS — C50419 Malignant neoplasm of upper-outer quadrant of unspecified female breast: Secondary | ICD-10-CM

## 2012-02-10 NOTE — Progress Notes (Signed)
MAMMOSITE TUBE REMOVED PER PROTOCOL BY DR. SARAH SQUIRE.  PATIENT TOLERATED WELL.  NO BLEEDING AND VERY SCANT AMOUNT OF SEROSANGUINESS FLUID NOTED.  SITE WITHOUT REDNESS OR EDEMA.  STERI STRIPS APPLIED PER DR. SQUIRE AND COVERED WITH 1 4X4 DSG.  GAVE PT A CARD TO GET A FU APPOINTMENT FOR 30 DAYS

## 2012-02-10 NOTE — Progress Notes (Signed)
   Department of Radiation Oncology  Phone:  [phone] Fax:        [phone] ________________________________  Weston Brass [place] MRN: [id number]  Date: 02/10/2012  DOB: [birth year]  HDR MammoSite Brachytherapy Procedure Note   DIAGNOSIS: Breast cancer.   SITE: . Right breast  CURRENT FRACTION: . 9  PLANNED FRACTION: 10   SIMULATION VERIFICATION: An image was obtained to verify device position and geometry. The balloon symmetry was verified. The balloon inflation was checked. After careful review, I approved the simulation film.   NARRATIVE: All involved HDR brachytherapy afterloader catheters were connected to the MammoSite device. All catheter numbers and catheter connections were checked by physics and me. The brachytherapy treatment was delivered under my personal supervision uneventfully. Then the transfer tubing was disconnected and the cap placed on the catheter.   DOSIMETRY: Based on the current source strength, 423.9 seconds was calculated to deliver the planned dose of 3.4 Gy.   DISPOSITION: Treatment was delivered uneventfully. Survey of the room after treatment confirmed that the radioactive source returned to the afterloader. The dressing was re-applied. The patient tolerated treatment well and will return as scheduled for their next fraction and/or follow up visit.    ________________________________   Radene Gunning, MD, PhD

## 2012-02-10 NOTE — Progress Notes (Signed)
   Department of Radiation Oncology  Phone:  [phone] Fax:        [phone] ________________________________  Weston Brass Mengel MRN: [id number]  Date: 02/10/2012  DOB: [birth year]  HDR MammoSite Brachytherapy Procedure Note   DIAGNOSIS: Breast cancer.   SITE:  Right breast  CURRENT FRACTION:  10  PLANNED FRACTION: 10   SIMULATION VERIFICATION: An image was obtained to verify device position and geometry. The balloon symmetry was verified. The balloon inflation was checked. After careful review, I approved the simulation film.   NARRATIVE: All involved HDR brachytherapy afterloader catheters were connected to the MammoSite device. All catheter numbers and catheter connections were checked by physics and me. The brachytherapy treatment was delivered under my personal supervision uneventfully. Then the transfer tubing was disconnected and the cap placed on the catheter.   DOSIMETRY: Based on the current source strength, 425.0 seconds seconds was calculated to deliver the planned dose of 3.4 Gy.   DISPOSITION: Treatment was delivered uneventfully. Survey of the room after treatment confirmed that the radioactive source returned to the afterloader. The dressing was re-applied. The patient tolerated treatment well and will return as scheduled for their next fraction and/or follow up visit. The patient will be brought around to the clinic for removal of the MammoSite catheter prior to her leaving our clinic today.   ________________________________   Radene Gunning, MD, PhD

## 2012-02-10 NOTE — Progress Notes (Signed)
I saw the patient following her final MammoSite treatment. I removed the MammoSite catheter in a sterile fashion without incident. She tolerated the procedure well. Our nurse applied sterile dressings.

## 2012-02-10 NOTE — Progress Notes (Signed)
MAMMOSITE DRESSING APPLIED PER PROTOCOL.  CLEANSED SITE WITH STERILE  NS, APPLIED NEOSPORIN, COVERED WITH 2 4X4'S AND ABD PAD TO HOLD OVER THIS.  SECURED WITH MESH TOP.  SITE LOOKS GOOD NO DRAINAGE AND JUST A SMALL AMOUNT OF REDNESS AROUND INCISION.  TOLERATED WELL.

## 2012-02-10 NOTE — Progress Notes (Signed)
Encounter addended by: Amanda Pea, RN on: 02/10/2012  4:28 PM<BR>     Documentation filed: Notes Section

## 2012-02-10 NOTE — Progress Notes (Signed)
Encounter addended by: Amanda Pea, RN on: 02/10/2012 10:07 AM<BR>     Documentation filed: Notes Section

## 2012-02-14 ENCOUNTER — Ambulatory Visit (INDEPENDENT_AMBULATORY_CARE_PROVIDER_SITE_OTHER): Payer: Medicare Other | Admitting: General Surgery

## 2012-02-14 ENCOUNTER — Telehealth: Payer: Self-pay | Admitting: *Deleted

## 2012-02-14 ENCOUNTER — Encounter (INDEPENDENT_AMBULATORY_CARE_PROVIDER_SITE_OTHER): Payer: Self-pay | Admitting: General Surgery

## 2012-02-14 VITALS — BP 102/82 | HR 80 | Temp 97.2°F | Resp 14 | Ht 62.5 in | Wt 162.8 lb

## 2012-02-14 DIAGNOSIS — C50419 Malignant neoplasm of upper-outer quadrant of unspecified female breast: Secondary | ICD-10-CM

## 2012-02-14 MED ORDER — DOXYCYCLINE HYCLATE 100 MG PO TABS: 100.0000 mg | ORAL_TABLET | Freq: Two times a day (BID) | ORAL | Status: AC

## 2012-02-14 NOTE — Progress Notes (Signed)
HISTORY: S/p r lumpectomy/sln/mammosite.  Mammosite removed 5 days ago.  She started developing heaviness and redness of her right breast and states her axilla didn't feel right.      EXAM: General:  Alert and oriented, no distress. Incision:  R breast red, no significant seroma found.  Aspiration attempt made, no fluid obtained.     PATHOLOGY: T1N0, path given to pt   ASSESSMENT AND PLAN:   Given doxycycline for cellulitis.     Maudry Diego, MD Surgical Oncology, General & Endocrine Surgery [facility]  Astrid Divine, MD, MD Maurice Small, MD

## 2012-02-14 NOTE — Patient Instructions (Signed)
Take antibiotic (doxycycline) twice daily.  Follow up with Dr. Jamey Ripa in 2 weeks.  Call if redness and swelling is not improving.

## 2012-02-14 NOTE — Telephone Encounter (Signed)
Joy Owens called to relay since her mammosite catheter removal that she has noted swelling "under her arm" with a "funny feeling" in this area.  When asked what it looks like she stated she could not see it very well but the area "feels fat".  She applied an ice pack last evening and states it feel a "little better",but wants this area examined.  She noted that her temp increased to 98.6 last pm vs. Her normal temp is 97.0.  After conferring with Dr. Dayton Scrape this RN made an appt. for Joy. Owens to see Dr. Almond Lint today at 1515.  Joy Owens called and is aggreeable with this plan of care.  Requested Joy. Owens call back after her visit to [facility] today.

## 2012-02-15 ENCOUNTER — Telehealth (INDEPENDENT_AMBULATORY_CARE_PROVIDER_SITE_OTHER): Payer: Self-pay | Admitting: General Surgery

## 2012-02-15 NOTE — Telephone Encounter (Signed)
Pt calling with concerns after urgent office visit yesterday.  She is able to tell me she was started on Doxycycline and has a hematoma.  In reviewing the notes of office visit, an attempt was made to withdraw fluid with no return.  Discussed hematoma vs seroma.  Discuss possible reasons for swelling in axilla following her lumpectomy and lymph node removal.  Suggested she try ibuprofen or naproxyn for discomfort and swelling, elevation of arm and hand and allow time for recovery.  Allowed pt to verbalize her fears and discomforts at this time.

## 2012-02-15 NOTE — Progress Notes (Signed)
  Radiation Oncology         [phone] ________________________________  Name: Joy Owens MRN: [id number]  Date: 02/10/2012  DOB: 07/01/1941  End of Treatment Note  Diagnosis:   Stage I right breast cancer     Indication for treatment:  Curative       Radiation treatment dates:  02/06/2012, 02/07/2012, 02/08/2012, 02/09/2012, 02/10/2012  Site/dose:   Right breast /34 gray at 3.4 gray per fraction x10 fractions  Beams/energy:   MammoSite brachytherapy/ iridium 192  Narrative: The patient tolerated radiation treatment relatively well.   She had some pain after her MammoSite catheter was removed but overall did well.  Plan: The patient has completed radiation treatment. The patient will return to radiation oncology clinic for routine followup in one month. I advised them to call or return sooner if they have any questions or concerns related to their recovery or treatment.  ------------------------------------------------  Lurline Hare, MD

## 2012-02-16 ENCOUNTER — Ambulatory Visit
Admission: RE | Admit: 2012-02-16 | Discharge: 2012-02-16 | Disposition: A | Discharge status: Home / Self Care | Payer: Medicare Other | Source: Ambulatory Visit | Attending: Radiation Oncology | Admitting: Radiation Oncology

## 2012-02-16 ENCOUNTER — Other Ambulatory Visit: Payer: Medicare Other | Admitting: Lab

## 2012-02-16 ENCOUNTER — Encounter: Payer: Self-pay | Admitting: Radiation Oncology

## 2012-02-16 ENCOUNTER — Ambulatory Visit: Payer: Medicare Other | Admitting: Physician Assistant

## 2012-02-16 VITALS — BP 118/84 | HR 75 | Temp 96.9°F | Resp 18

## 2012-02-16 DIAGNOSIS — C50419 Malignant neoplasm of upper-outer quadrant of unspecified female breast: Secondary | ICD-10-CM

## 2012-02-16 NOTE — Progress Notes (Signed)
Patient presents to the clinic today requesting to be seen by Dr. Michell Heinrich concerned about right breast and old mammosite. Patient is alert and oriented to person, place, and time. No distress noted. Steady gait noted. Pleasant affect noted. Patient denies pain at this time. Patient reports her right breast feels sore when she walks. Patient reports her "right arm feels funny." Also, patient reports she feelings "like something is growing under her right arm." Right breast fevered compared to left. Incision sites well approximated. Patient refused weight. Patient denies nausea, vomiting, diarrhea, headache or dizziness. Reported all findings to Dr. Michell Heinrich.

## 2012-02-16 NOTE — Progress Notes (Signed)
Radiation Oncology         [phone] ________________________________  Name: Joy Owens MRN: [id number]  Date: 02/16/2012  DOB: August 21, 1941  Follow-Up Visit Note  CC: Astrid Divine, MD, MD  Jamey Ripa, Reola Mosher, MD  Diagnosis:   Stage I right breast cancer  Interval Since Last Radiation:  One week  Narrative:  The patient returns today for follow-up.  He is very concerned about some swelling underneath her right arm. Is also been having occasional sharp pains in her right breast and some swelling of her right breast. She's had occasional pains in the under portion of her right arm as well. She was seen by Dr. Donell Beers in Dr. Tenna Child absence. Dr. Donell Beers attempted aspiration but no fluid could be found. She put her on doxycycline which she has been taking. She thinks her breast appears about the same as it did when she saw Dr. Donell Beers earlier this week. She is not running any fevers. She does not take any pain medications.                              ALLERGIES:  is allergic to sudafed.  Meds: Current Outpatient Prescriptions  Medication Sig Dispense Refill  . doxycycline (VIBRA-TABS) 100 MG tablet Take 1 tablet (100 mg total) by mouth 2 (two) times daily.  20 tablet  0  . MICARDIS HCT 40-12.5 MG per tablet 1 tablet daily. AM      . Multiple Vitamins-Minerals (MULTIVITAMIN PO) Take 1 tablet by mouth daily.      . sertraline (ZOLOFT) 25 MG tablet Take 25 mg by mouth daily. PM      . solifenacin (VESICARE) 5 MG tablet Take 5 mg by mouth daily. AM      . zolpidem (AMBIEN) 5 MG tablet Take 5 mg by mouth as needed.       . calcium carbonate (OS-CAL) 600 MG TABS Take 600 mg by mouth 2 (two) times daily with a meal.      . cholecalciferol (VITAMIN D) 1000 UNITS tablet Take 1,000 Units by mouth daily.        Physical Findings: The patient is in no acute distress. Patient is alert and oriented. . she has a slightly pink right breast. Her incisions are clean dry and intact. There is  no signs of infection. She has a area of edema in the superior and anterior to her sentinel lymph node incision. This again does not appear infected and is nontender to palpation. She has no lymphedema of her right or left arm. She has some edema around her areola.  oral temperature is 96.9 F (36.1 C). Her blood pressure is 118/84 and her pulse is 75. Her respiration is 18. .   Lab Findings: Lab Results  Component Value Date   WBC 5.3 01/11/2012   HGB 14.9 01/30/2012   HCT 37.5 01/11/2012   MCV 87.6 01/11/2012   PLT 235 01/11/2012      Radiographic Findings: Nm Sentinel Node Inj-no Rpt (breast)  02/07/2012  Sulfur colloid was injected intradermally by the nuclear medicine technologist for breast cancer sentinel node localization.  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 The patient and I discussed the procedure of needle localization including benefits and alternatives. We discussed the high likelihood of a successful procedure. We discussed the risks of the procedure, including infection, bleeding, tissue injury and further surgery. Informed written consent was given.  Using mammographic guidance, sterile technique, 2% lidocaine and a 9 cm modified Kopans needle, the mass in the right upper outer quadrant posteriorly was localized using a lateromedial approach. Films were labeled and sent with the patient surgery.  She tolerated the procedure well.  Specimen radiograph was performed at [facility] and confirms the clip, wire and residual calcifications to be present in the tissue sample. Specimen radiograph was reviewed by Dr. Jean Rosenthal at the time  was performed.  The specimen is marked for pathology.  IMPRESSION: Needle localization right breast.  No apparent complications.  Original Report Authenticated By: Daryl Eastern, M.D.   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 The patient and I discussed the procedure of needle localization including benefits and alternatives. We discussed the high likelihood of a successful procedure. We discussed the risks of the procedure, including infection, bleeding, tissue injury and further surgery. Informed written consent was given.  Using mammographic guidance, sterile technique, 2% lidocaine and a 9 cm modified Kopans needle, the mass in the right upper outer quadrant posteriorly was localized using a lateromedial approach. Films were labeled and sent with the patient surgery.  She tolerated the procedure well.  Specimen radiograph was performed at [facility] and confirms the clip, wire and residual calcifications to be present in the tissue sample. Specimen radiograph was reviewed by Dr. Jean Rosenthal at the time was performed.  The specimen is marked for pathology.  IMPRESSION: Needle localization right breast.  No apparent complications.  Original Report Authenticated By: Daryl Eastern, M.D.    Impression:  The patient is recovering from the effects of radiation.  Plan:  I think everything she is describing is perfectly normal. I think it is prudent to continue on the doxycycline. We will check in with her next week and she will see Dr. Jamey Ripa when he returns the following week. I encouraged her to attend the after breast cancer class at physical therapy and she has appointment to see them  the first week of June. I encouraged her to contact us with any questions. Thanks so much   _____________________________________

## 2012-02-20 ENCOUNTER — Encounter: Payer: Self-pay | Admitting: *Deleted

## 2012-02-20 NOTE — Progress Notes (Signed)
Pt called with request for physical therapy for right arm post surgery. Pt describes redness and swelling at the surgical site. Pt reports that she saw Dr Donell Beers 5/7th and she is tx her with ABX for possible infection. Pt states that "it is no better" abx tx is for 10 days. Pt is  Scheduled to see CS on 5/17th. It was suggested to pt that she may return to Dr Arita Miss office for questions regarding surgery and possible referral to physical therapy

## 2012-02-22 ENCOUNTER — Other Ambulatory Visit: Payer: Self-pay | Admitting: Radiation Oncology

## 2012-02-22 DIAGNOSIS — C50419 Malignant neoplasm of upper-outer quadrant of unspecified female breast: Secondary | ICD-10-CM

## 2012-02-23 ENCOUNTER — Ambulatory Visit: Payer: Medicare Other | Attending: Radiation Oncology | Admitting: Physical Therapy

## 2012-02-23 DIAGNOSIS — R609 Edema, unspecified: Secondary | ICD-10-CM | POA: Insufficient documentation

## 2012-02-23 DIAGNOSIS — IMO0001 Reserved for inherently not codable concepts without codable children: Secondary | ICD-10-CM | POA: Insufficient documentation

## 2012-02-23 DIAGNOSIS — M25619 Stiffness of unspecified shoulder, not elsewhere classified: Secondary | ICD-10-CM | POA: Insufficient documentation

## 2012-02-24 ENCOUNTER — Telehealth: Payer: Self-pay | Admitting: *Deleted

## 2012-02-24 ENCOUNTER — Encounter: Payer: Self-pay | Admitting: Physician Assistant

## 2012-02-24 ENCOUNTER — Other Ambulatory Visit (HOSPITAL_BASED_OUTPATIENT_CLINIC_OR_DEPARTMENT_OTHER): Payer: Medicare Other | Admitting: Lab

## 2012-02-24 ENCOUNTER — Ambulatory Visit (HOSPITAL_BASED_OUTPATIENT_CLINIC_OR_DEPARTMENT_OTHER): Payer: Medicare Other | Admitting: Physician Assistant

## 2012-02-24 DIAGNOSIS — C50419 Malignant neoplasm of upper-outer quadrant of unspecified female breast: Secondary | ICD-10-CM

## 2012-02-24 DIAGNOSIS — Z170 Estrogen receptor positive status [ER+]: Secondary | ICD-10-CM

## 2012-02-24 DIAGNOSIS — E559 Vitamin D deficiency, unspecified: Secondary | ICD-10-CM

## 2012-02-24 MED ORDER — ANASTROZOLE 1 MG PO TABS: 1.0000 mg | ORAL_TABLET | Freq: Every day | ORAL | Status: AC

## 2012-02-24 NOTE — Progress Notes (Signed)
Hematology and Oncology Follow Up Visit  Joy Owens [id number] July 22, 1941 71 y.o. 02/24/2012    HPI: Patient is a 71 year old [place] woman with a history of an 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 Michell Heinrich, completed 02/10/2012.   Interim History:   Joy Owens returns for office today prior to initiating the aromatase inhibitor for her history of a strongly ER/PR positive HER-2 negative right breast carcinoma for which she underwent a right lumpectomy with sentinel node dissection followed by MammoSite radiotherapy which was completed on 02/10/2012. She is recently completed a course of doxycycline as prescribed by Dr. Donell Beers and Dr. Tenna Child absence for possible mild cellulitis of the right breast, but is felt to these changes are secondary to her radiation therapy. She denies any fevers, chills or night sweats. No shortness of breath, or chest pain. No nausea, emesis, diarrhea or constipation. A detailed review of systems is otherwise noncontributory as noted below.  Review of Systems: Constitutional:  no weight loss, fever, night sweats and feels well Eyes: No complaints ENT: No complaints Cardiovascular: no chest pain or dyspnea on exertion Respiratory: no cough, shortness of breath, or wheezing Neurological: no TIA or stroke symptoms Dermatological: negative Gastrointestinal: no abdominal pain, change in bowel habits, or black or bloody stools Genito-Urinary: no dysuria, trouble voiding, or hematuria Hematological and Lymphatic: negative Breast: negative Musculoskeletal: negative Remaining ROS negative.   Medications:   I have reviewed the patient's current medications.  Current Outpatient Prescriptions  Medication Sig Dispense Refill  . anastrozole (ARIMIDEX) 1 MG tablet Take 1 tablet (1 mg  total) by mouth daily.  30 tablet  11  . calcium carbonate (OS-CAL) 600 MG TABS Take 600 mg by mouth 2 (two) times daily with a meal.      . cholecalciferol (VITAMIN D) 1000 UNITS tablet Take 1,000 Units by mouth daily.      Marland Kitchen doxycycline (VIBRA-TABS) 100 MG tablet Take 1 tablet (100 mg total) by mouth 2 (two) times daily.  20 tablet  0  . MICARDIS HCT 40-12.5 MG per tablet 1 tablet daily. AM      . Multiple Vitamins-Minerals (MULTIVITAMIN PO) Take 1 tablet by mouth daily.      . sertraline (ZOLOFT) 25 MG tablet Take 25 mg by mouth daily. PM      . solifenacin (VESICARE) 5 MG tablet Take 5 mg by mouth daily. AM      . zolpidem (AMBIEN) 5 MG tablet Take 5 mg by mouth as needed.         Allergies:  Allergies  Allergen Reactions  . Sudafed (Pseudoephedrine Hcl)     Makes pt jittery    Physical Exam: There were no vitals filed for this visit.  There is no height or weight on file to calculate BMI. HEENT:  Sclerae anicteric, conjunctivae pink.  Oropharynx clear.  No mucositis or candidiasis.   Nodes:  No cervical, supraclavicular, or axillary lymphadenopathy palpated.  Breast Exam:  The right breast was examined, there is some diffuse mild redness throughout a good portion of the breast, maybe slight heat. Patient is afebrile. Lungs:  Clear to auscultation bilaterally.  No crackles, rhonchi, or wheezes.   Heart:  Regular rate and rhythm.   Abdomen:  Soft, nontender.  Positive bowel sounds.  No organomegaly or masses palpated.   Musculoskeletal:  No focal spinal tenderness to palpation.  Extremities:  Benign.  No peripheral edema or cyanosis.   Skin:  Benign.   Neuro:  Nonfocal, alert and oriented x 3.   Lab Results: Lab Results  Component Value Date   WBC 5.3 01/11/2012   HGB 14.9 01/30/2012   HCT 37.5 01/11/2012   MCV 87.6 01/11/2012   PLT 235 01/11/2012   NEUTROABS 2.6 01/11/2012     Chemistry      Component Value Date/Time   NA 139 01/24/2012 1625   K 3.7 01/24/2012 1625   CL 101  01/24/2012 1625   CO2 25 01/24/2012 1625   BUN 25* 01/24/2012 1625   CREATININE 0.90 01/24/2012 1625      Component Value Date/Time   CALCIUM 9.8 01/24/2012 1625   ALKPHOS 52 01/11/2012 0823   AST 20 01/11/2012 0823   ALT 21 01/11/2012 0823   BILITOT 0.3 01/11/2012 0823      Lab Results  Component Value Date   LABCA2 14 01/11/2012    Radiological Studies: Nm Sentinel Node Inj-no Rpt (breast) 02/07/2012  Sulfur colloid was injected intradermally by the nuclear medicine technologist for breast cancer sentinel node localization.  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 The patient and I discussed the procedure of needle localization including benefits and alternatives. We discussed the high likelihood of a successful procedure. We discussed the risks of the procedure, including infection, bleeding, tissue injury and further surgery. Informed written consent was given.  Using mammographic guidance, sterile technique, 2% lidocaine and a 9 cm modified Kopans needle, the mass in the right upper outer quadrant posteriorly was localized using a lateromedial approach. Films were labeled and sent with the patient surgery.  She tolerated the procedure well.  Specimen radiograph was performed at [facility] and confirms the clip, wire and residual calcifications to be present in the tissue sample. Specimen radiograph was reviewed by Dr. Jean Rosenthal at the time was performed.  The specimen is marked for pathology.  IMPRESSION: Needle localization right breast.  No apparent complications.  Original Report Authenticated By: Daryl Eastern, M.D.   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 The patient and I discussed the procedure of needle localization including benefits and alternatives. We discussed the high likelihood of a successful procedure. We discussed the risks of the procedure, including infection, bleeding, tissue injury and further surgery. Informed written consent was given.  Using mammographic guidance, sterile technique, 2% lidocaine and a 9 cm modified Kopans needle, the mass in the right upper outer quadrant posteriorly was localized using a lateromedial approach. Films were labeled and sent with the patient surgery.  She tolerated the procedure well.  Specimen radiograph was performed at [facility] and confirms the clip, wire and residual calcifications to be present in the tissue sample. Specimen radiograph was reviewed by Dr. Jean Rosenthal at the time was performed.  The specimen is marked for pathology.  IMPRESSION: Needle localization  right breast.  No apparent complications.  Original Report Authenticated By: Daryl Eastern, M.D.     Assessment:  Patient is a 71 year old [place] woman with a history of an 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 Michell Heinrich, completed 02/10/2012.  2.  Ongoing skin changes over the remaining right breast consistent with probable radiation induced changes, recent completion of doxycycline as prescribed by Dr. Donell Beers.  Pending physical therapy initiation.  Case to be reviewed with Dr. Pierce Crane.  Plan:  Patient will initiate  Arimidex 1 mg by  mouth daily, side effect profile has been reviewed. Of note, patient has a history of mild osteopenia, her last bone density was obtained December of 2012. Side effect profile has been reviewed.  She will return to see Dr. Donnie Coffin in one month's time.  Pertaining to the right breast changes, she will remain off an antibiotic at this point. We will be contacting physical therapy on 02/27/2012, but anticipate will be delaying initiation of therapy for another couple weeks. (This case has also been reviewed with Dr. Lurline Hare). Patient understands and agrees.    Clenton Esper T, PA-C 02/24/2012

## 2012-02-25 LAB — VITAMIN D 25 HYDROXY (VIT D DEFICIENCY, FRACTURES): Vit D, 25-Hydroxy: 55 ng/mL (ref 30–89)

## 2012-02-27 ENCOUNTER — Telehealth: Payer: Self-pay | Admitting: *Deleted

## 2012-02-27 NOTE — Telephone Encounter (Signed)
gave patient appointment for 03-23-2012 starting at 2:45pm with labs printed out calendar  and gave to the patient

## 2012-03-02 ENCOUNTER — Encounter (INDEPENDENT_AMBULATORY_CARE_PROVIDER_SITE_OTHER): Payer: Self-pay | Admitting: Surgery

## 2012-03-02 ENCOUNTER — Ambulatory Visit (INDEPENDENT_AMBULATORY_CARE_PROVIDER_SITE_OTHER): Payer: Medicare Other | Admitting: Surgery

## 2012-03-02 VITALS — BP 110/78 | HR 88 | Resp 20 | Ht 62.5 in | Wt 161.0 lb

## 2012-03-02 DIAGNOSIS — C50419 Malignant neoplasm of upper-outer quadrant of unspecified female breast: Secondary | ICD-10-CM

## 2012-03-02 NOTE — Patient Instructions (Signed)
See me again in about three months   See me again in about three months

## 2012-03-02 NOTE — Progress Notes (Signed)
Joy Owens    [id number] 03/02/2012    09-19-1941   CC: Post op lumpectomy, SLN and mammosite  HPI: The patient returns for post op follow-up. She underwent a right lumpectomy on 01/30/12. Over all she feels that she is doing well.Conmtniues to have some discomfort in the breast, notes some cording in the axilla and some discomfort down the right arm4   PE: The incision is healing nicely and there is no evidence of infection or hematoma. She has mild cording in the axilla   IMPRESSION: Patient doing well. No significant problems apparent today  PLAN: Her next visit will be in two months. She is scheduled for the ABC class in about two weeks. Encouraged some massage for the cording.

## 2012-03-07 ENCOUNTER — Telehealth: Payer: Self-pay | Admitting: Radiation Oncology

## 2012-03-07 NOTE — Telephone Encounter (Signed)
Left message requesting return call. Encouraged patient to call back tomorrow since this writer will be out of the office until then. Will follow up with this patient tomorrow.

## 2012-03-07 NOTE — Telephone Encounter (Signed)
Patient phoned this writer today to inform her she was attending the ABC class Dr. Michell Heinrich recommended but, is "confused if she should be going for additional physical therapy." Also, the patient wants this Clinical research associate to inform Dr. Michell Heinrich that Dr. Gaston Islam informed her she has "webbing/cording." Lastly, patient questioned why she didn't have a follow up with Dr. Michell Heinrich again. However, patient denies need to see her. Routed this message to Dr. Michell Heinrich. Will call patient back with answers.

## 2012-03-09 ENCOUNTER — Telehealth: Payer: Self-pay | Admitting: Radiation Oncology

## 2012-03-09 NOTE — Telephone Encounter (Signed)
Patient returned this writer's call this morning. Per Dr. Luciano Cutter order directed patient to move forward with ABC class the first of June. Arranged follow up with Dr. Michell Heinrich 04/04/12 at 3:00 pm and informed patient of such. Also, informed patient she will be evaluated by Dr. Michell Heinrich on the 04/04/2012 follow up to determine if any additional physical therapy is needed. Encouraged patient to call with future needs. Patient verbalized understanding. Routed this message to Dr. Michell Heinrich.

## 2012-03-12 ENCOUNTER — Encounter: Payer: Medicare Other | Admitting: Physical Therapy

## 2012-03-15 ENCOUNTER — Ambulatory Visit: Payer: Medicare Other | Admitting: Radiation Oncology

## 2012-03-16 ENCOUNTER — Ambulatory Visit: Payer: Medicare Other | Admitting: Oncology

## 2012-03-16 ENCOUNTER — Other Ambulatory Visit: Payer: Medicare Other | Admitting: Lab

## 2012-03-19 ENCOUNTER — Encounter: Payer: Medicare Other | Admitting: Physical Therapy

## 2012-03-21 ENCOUNTER — Ambulatory Visit: Payer: Medicare Other | Attending: Surgery

## 2012-03-21 DIAGNOSIS — M25619 Stiffness of unspecified shoulder, not elsewhere classified: Secondary | ICD-10-CM | POA: Insufficient documentation

## 2012-03-21 DIAGNOSIS — IMO0001 Reserved for inherently not codable concepts without codable children: Secondary | ICD-10-CM | POA: Insufficient documentation

## 2012-03-21 DIAGNOSIS — R609 Edema, unspecified: Secondary | ICD-10-CM | POA: Insufficient documentation

## 2012-03-23 ENCOUNTER — Ambulatory Visit: Payer: Medicare Other | Admitting: Physician Assistant

## 2012-03-23 ENCOUNTER — Other Ambulatory Visit: Payer: Medicare Other | Admitting: Lab

## 2012-03-27 ENCOUNTER — Ambulatory Visit (HOSPITAL_BASED_OUTPATIENT_CLINIC_OR_DEPARTMENT_OTHER): Payer: Medicare Other | Admitting: Physician Assistant

## 2012-03-27 ENCOUNTER — Encounter: Payer: Self-pay | Admitting: Physician Assistant

## 2012-03-27 ENCOUNTER — Other Ambulatory Visit (HOSPITAL_BASED_OUTPATIENT_CLINIC_OR_DEPARTMENT_OTHER): Payer: Medicare Other | Admitting: Lab

## 2012-03-27 ENCOUNTER — Telehealth: Payer: Self-pay | Admitting: *Deleted

## 2012-03-27 VITALS — BP 122/79 | HR 62 | Temp 97.4°F

## 2012-03-27 DIAGNOSIS — C50419 Malignant neoplasm of upper-outer quadrant of unspecified female breast: Secondary | ICD-10-CM

## 2012-03-27 LAB — CBC WITH DIFFERENTIAL/PLATELET
Basophils Absolute: 0.1 10*3/uL (ref 0.0–0.1)
EOS%: 3.8 % (ref 0.0–7.0)
Eosinophils Absolute: 0.2 10*3/uL (ref 0.0–0.5)
HGB: 13 g/dL (ref 11.6–15.9)
NEUT#: 2.9 10*3/uL (ref 1.5–6.5)
RBC: 4.33 10*6/uL (ref 3.70–5.45)
RDW: 12.8 % (ref 11.2–14.5)
lymph#: 1.5 10*3/uL (ref 0.9–3.3)

## 2012-03-27 LAB — COMPREHENSIVE METABOLIC PANEL
AST: 16 U/L (ref 0–37)
Albumin: 4 g/dL (ref 3.5–5.2)
BUN: 26 mg/dL — ABNORMAL HIGH (ref 6–23)
Calcium: 9.8 mg/dL (ref 8.4–10.5)
Chloride: 101 mEq/L (ref 96–112)
Glucose, Bld: 85 mg/dL (ref 70–99)
Potassium: 3.6 mEq/L (ref 3.5–5.3)
Sodium: 140 mEq/L (ref 135–145)
Total Protein: 7.3 g/dL (ref 6.0–8.3)

## 2012-03-27 NOTE — Telephone Encounter (Signed)
Made patient appointment for 09-27-2012 starting at 1:30pm

## 2012-03-27 NOTE — Progress Notes (Signed)
Hematology and Oncology Follow Up Visit  Joy Owens [id number] 04-Jul-1941 71 y.o. 03/27/2012    HPI: Patient is a 71 year old Joy Owens woman with a history of an 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 Michell Heinrich, completed 02/10/2012. Arimidex since 02/24/12   Interim History:   Joy Owens returns for office today after initiating Arimidex for her history of a strongly ER/PR positive HER-2 negative right breast carcinoma for which she underwent a right lumpectomy with sentinel node dissection followed by MammoSite radiotherapy which was completed on 02/10/2012. She denies any fevers, chills or night sweats. No shortness of breath, or chest pain. No nausea, emesis, diarrhea or constipation.  She is experiencing hot flashes.  She denies joint sensitivity. A detailed review of systems is otherwise noncontributory as noted below.  Review of Systems: Constitutional:  no weight loss, fever, night sweats and feels well Eyes: No complaints ENT: No complaints Cardiovascular: no chest pain or dyspnea on exertion Respiratory: no cough, shortness of breath, or wheezing Neurological: no TIA or stroke symptoms Dermatological: negative Gastrointestinal: no abdominal pain, change in bowel habits, or black or bloody stools Genito-Urinary: no dysuria, trouble voiding, or hematuria Hematological and Lymphatic: negative Breast: negative Musculoskeletal: negative Remaining ROS negative.   Medications:   I have reviewed the patient's current medications.  Current Outpatient Prescriptions  Medication Sig Dispense Refill  . anastrozole (ARIMIDEX) 1 MG tablet Take 1 mg by mouth daily.      . calcium carbonate (OS-CAL) 600 MG TABS Take 600 mg by mouth 2 (two) times daily with a meal.      . cholecalciferol (VITAMIN D) 1000 UNITS  tablet Take 1,000 Units by mouth daily.      Marland Kitchen MICARDIS HCT 40-12.5 MG per tablet 1 tablet daily. AM      . Multiple Vitamins-Minerals (MULTIVITAMIN PO) Take 1 tablet by mouth daily.      . sertraline (ZOLOFT) 25 MG tablet Take 25 mg by mouth daily. PM      . solifenacin (VESICARE) 5 MG tablet Take 5 mg by mouth daily. AM      . zolpidem (AMBIEN) 5 MG tablet Take 5 mg by mouth as needed.         Allergies:  Allergies  Allergen Reactions  . Sudafed (Pseudoephedrine Hcl)     Makes pt jittery    Physical Exam: Filed Vitals:   03/27/12 1524  BP: 122/79  Pulse: 62  Temp: 97.4 F (36.3 C)    There is no height or weight on file to calculate BMI. HEENT:  Sclerae anicteric, conjunctivae pink.  Oropharynx clear.  No mucositis or candidiasis.   Nodes:  No cervical, supraclavicular, or axillary lymphadenopathy palpated.  Breast Exam:  The right breast was examined, there is much less redness the breast. Axilla is clear. Lungs:  Clear to auscultation bilaterally.  No crackles, rhonchi, or wheezes.   Heart:  Regular rate and rhythm.   Abdomen:  Soft, nontender.  Positive bowel sounds.  No organomegaly or masses palpated.   Musculoskeletal:  No focal spinal tenderness to palpation.  Extremities:  Benign.  No peripheral edema or cyanosis.   Skin:  Benign.   Neuro:  Nonfocal, alert and oriented x 3.   Lab Results: Lab Results  Component Value Date   WBC 5.2 03/27/2012   HGB 13.0 03/27/2012   HCT 38.5 03/27/2012  MCV 88.9 03/27/2012   PLT 226 03/27/2012   NEUTROABS 2.9 03/27/2012     Chemistry      Component Value Date/Time   NA 139 01/24/2012 1625   K 3.7 01/24/2012 1625   CL 101 01/24/2012 1625   CO2 25 01/24/2012 1625   BUN 25* 01/24/2012 1625   CREATININE 0.90 01/24/2012 1625      Component Value Date/Time   CALCIUM 9.8 01/24/2012 1625   ALKPHOS 52 01/11/2012 0823   AST 20 01/11/2012 0823   ALT 21 01/11/2012 0823   BILITOT 0.3 01/11/2012 0823      Lab Results  Component Value Date    LABCA2 14 01/11/2012     Assessment:  Patient is a 71 year old Joy Owens woman with a history of an 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 Michell Heinrich, completed 02/10/2012. Initiation of Arimidex 1mg  daily on 02/24/12.  Case reviewed with Dr. Pierce Crane, who also spoke with patient.  Plan:  Patient will continue Arimidex 1 mg by  mouth daily,  and return to see Dr. Donnie Coffin in six month's time.  Will will plan on 6 month post XRT diagnostic mammogram for the right side in early November 2013 at the [facility]. Patient understands and agrees.    Naseer Hearn T, PA-C 03/27/2012

## 2012-03-30 ENCOUNTER — Ambulatory Visit: Payer: Medicare Other | Admitting: Physical Therapy

## 2012-04-02 ENCOUNTER — Ambulatory Visit: Payer: Medicare Other | Admitting: Physical Therapy

## 2012-04-04 ENCOUNTER — Encounter: Payer: Self-pay | Admitting: Radiation Oncology

## 2012-04-04 ENCOUNTER — Ambulatory Visit
Admission: RE | Admit: 2012-04-04 | Discharge: 2012-04-04 | Disposition: A | Discharge status: Home / Self Care | Payer: Medicare Other | Source: Ambulatory Visit | Attending: Radiation Oncology | Admitting: Radiation Oncology

## 2012-04-04 VITALS — BP 116/76 | HR 86 | Temp 97.5°F | Resp 20

## 2012-04-04 DIAGNOSIS — C50419 Malignant neoplasm of upper-outer quadrant of unspecified female breast: Secondary | ICD-10-CM

## 2012-04-04 MED ORDER — DOXYCYCLINE HYCLATE 100 MG PO TABS: 100.0000 mg | ORAL_TABLET | Freq: Three times a day (TID) | ORAL | Status: AC

## 2012-04-04 NOTE — Progress Notes (Signed)
Department of Radiation Oncology  Phone:  [phone] Fax:        [phone]   Name: Quiera Diffee Odle   DOB: 12-May-1941  MRN: [id number]    Date: 04/04/2012  Follow Up Visit Note  CC: GRIFFIN,ELAINE COLLINS  Diagnosis: Stage I Right Breast Cancer  Interval since last radiation: 2 months  Allergies:  Allergies  Allergen Reactions  . Sudafed (Pseudoephedrine Hcl)     Makes pt jittery    Medications:  Current Outpatient Prescriptions  Medication Sig Dispense Refill  . anastrozole (ARIMIDEX) 1 MG tablet Take 1 mg by mouth daily.      . calcium carbonate (OS-CAL) 600 MG TABS Take 600 mg by mouth 2 (two) times daily with a meal.      . cholecalciferol (VITAMIN D) 1000 UNITS tablet Take 1,000 Units by mouth daily.      Marland Kitchen MICARDIS HCT 40-12.5 MG per tablet 1 tablet daily. AM      . Multiple Vitamins-Minerals (MULTIVITAMIN PO) Take 1 tablet by mouth daily.      . sertraline (ZOLOFT) 25 MG tablet Take 25 mg by mouth daily. PM      . solifenacin (VESICARE) 5 MG tablet Take 5 mg by mouth daily. AM      . zolpidem (AMBIEN) 5 MG tablet Take 5 mg by mouth as needed.       . doxycycline (VIBRA-TABS) 100 MG tablet Take 1 tablet (100 mg total) by mouth 3 (three) times daily.  30 tablet  0    Interval History: Vyctoria presents today for followup.  She called and requested this appointment as her right breast continues to be sore. She has been to physical therapy twice first on Friday and then on Monday. This was at her request. We had sent her to the [facility] class which I really unclear as to whether she ultimately I tended that or not when I initially saw her back a month ago. At that point she was is having some minor right arm symptoms of nerve pain in [place]. I thought the physical therapy canal polyp that. She unfortunately has developed over the past 24-48 hours and redness of her breast as well as warmth. She is not running any fevers. Her breast is extremely painful to touch however.  She did have a massage yesterday and physical therapy on Monday. She states was loose was very easy on her right breast knowing her history of breast cancer. She says is the worst her breast is ever felt and it feels heavy in his painful even to move slightly. She has no pain in the other breast. She's not had any imaging.  Physical Exam:   oral temperature is 97.5 F (36.4 C). Her blood pressure is 116/76 and her pulse is 86. Her respiration is 20.  Her right breast is swollen as compared to the left. She has an approximate 10 cm area of erythema over the lateral aspect of the breast extending from the sentinel lymph node scar into the inframammary fold. This is very painful to touch. There is edema of the skin.  IMPRESSION: Cellulitis of the right breast status post MammoSite brachytherapy  PLAN:  Kayleigh is a 71 y.o. female who I think is develop cellulitis. This definitely looks worse than I have seen it in the past. I put her on doxycycline 100 mg 3 times a day. Dr. Jamey Ripa is out of town but I call his office who were able to schedule an appointment with  her on Tuesday.  I am not really able to palpate a defined seroma cavity which could be drained and could be the source of this infection. I've asked her to cancel physical therapy for now. I will plan on seeing her back in 2 weeks. I'll have my nurse call her on Friday to see how things are going. If she appears not to be getting better or is getting worse we will send her over to the on-call physician at Dr. Tenna Child office.    Lurline Hare, MD

## 2012-04-04 NOTE — Progress Notes (Signed)
Pt states her right breast "is not feeling good at all. Today is the worst day since my surgery." She describes it as "not painful but hurting and uncomfortable". Right breast is red, swollen, was "slightly warm to touch this morning", per pt. Pt has not taken any OTC med for this, states she "usually just bears with it". Pt has FU w/Dr Jamey Ripa 05/01/12.  Continues on Arimidex.

## 2012-04-05 ENCOUNTER — Encounter: Payer: Medicare Other | Admitting: Physical Therapy

## 2012-04-05 ENCOUNTER — Telehealth: Payer: Self-pay | Admitting: Radiation Oncology

## 2012-04-05 NOTE — Telephone Encounter (Signed)
Upon arrival to work message left to phone this patient at home. Return call. Patient reports that during the night she ran a fever of 102.5 but that it resolved this morning. Patient denies change in appearance of breast and pain from yesterday. Patient reports taking Motrin to relieve "soreness" felt in breast. Reviewed this matter with Dr. Michell Heinrich. Per Dr. Luciano Cutter order phoned patient back encouraged her to take doxycycline as directed allowing more time for it to work. Reminded patient of follow up appointment with Dr. Jamey Ripa on 04/10/2012 at 4:30 pm. Instructed patient this writer would phone her tomorrow to check status. Encouraged patient to call with needs if they presented before that time. Patient verbalized understanding of all things reviewed.

## 2012-04-06 ENCOUNTER — Ambulatory Visit (INDEPENDENT_AMBULATORY_CARE_PROVIDER_SITE_OTHER): Payer: Medicare Other | Admitting: Surgery

## 2012-04-06 ENCOUNTER — Ambulatory Visit
Admission: RE | Admit: 2012-04-06 | Discharge: 2012-04-06 | Disposition: A | Discharge status: Home / Self Care | Payer: Medicare Other | Source: Ambulatory Visit | Attending: Surgery | Admitting: Surgery

## 2012-04-06 ENCOUNTER — Other Ambulatory Visit (INDEPENDENT_AMBULATORY_CARE_PROVIDER_SITE_OTHER): Payer: Self-pay | Admitting: Surgery

## 2012-04-06 ENCOUNTER — Encounter (INDEPENDENT_AMBULATORY_CARE_PROVIDER_SITE_OTHER): Payer: Self-pay | Admitting: Surgery

## 2012-04-06 ENCOUNTER — Telehealth (INDEPENDENT_AMBULATORY_CARE_PROVIDER_SITE_OTHER): Payer: Self-pay | Admitting: General Surgery

## 2012-04-06 VITALS — BP 106/68 | HR 71 | Temp 97.2°F | Resp 16 | Ht 62.5 in

## 2012-04-06 DIAGNOSIS — N611 Abscess of the breast and nipple: Secondary | ICD-10-CM

## 2012-04-06 DIAGNOSIS — C50419 Malignant neoplasm of upper-outer quadrant of unspecified female breast: Secondary | ICD-10-CM

## 2012-04-06 DIAGNOSIS — N61 Mastitis without abscess: Secondary | ICD-10-CM | POA: Insufficient documentation

## 2012-04-06 HISTORY — PX: BREAST CYST ASPIRATION: SHX578

## 2012-04-06 NOTE — Patient Instructions (Signed)
To Breast Center for ultrasound exam now.  Velora Heckler, MD, [facility] Office: [phone]

## 2012-04-06 NOTE — Telephone Encounter (Signed)
Joy Owens called to report that she had a lumpectomy/mammosite/ radiation in April/ she saw Dr. Michell Heinrich 04-04-12 and had developed redness over incision area/she was started on doxycycline. She called today to report that redness area has increased/ I asked if she contacted Dr. Luciano Cutter office and she said the nurse was suppose to call her today/ I instructed her to call and speak with the nurse this am to see if treatment needed to change?/ I also scheduled her to see Dr. Gerrit Friends in urgent office today/ gy

## 2012-04-06 NOTE — Progress Notes (Addendum)
General Surgery [facility]  Visit Diagnoses: 1. Cancer of upper-outer quadrant of female breast     HISTORY: Patient is a 71 year old white female who underwent a right partial mastectomy and treatment with MammoSite for breast carcinoma in April 2013. Patient was seen in early May with cellulitis. She was treated with oral doxycycline with improvement. Patient now returns with a three-day history of redness, swelling, and pain involving the right breast. She was seen by her radiation oncologist 3 days ago and started on doxycycline. Symptoms have persisted while on therapy. Patient now presents for further evaluation.  EXAM: Right breast shows erythema involving essentially the entire breast. It is most significant in the upper outer quadrant around the surgical incision. There is induration. There is marked tenderness. There appears to be some fluctuance. There is no drainage.  IMPRESSION: Right breast with cellulitis, rule out abscess, following right partial mastectomy and radiation treatment  PLAN: I discussed the case with radiology at the breast center. Patient will go upstairs for a ultrasound of the right breast to rule out seroma or abscess. If there is any significant collection the patient may require drainage. Since her symptoms are progressing on oral antibiotic therapy, she may require admission for intravenous antibiotics.  Patient will return for further assessment following ultrasound examination today.  Velora Heckler, MD, FACS General & Endocrine Surgery [facility]  ADDENDUM: Patient was seen at the Breast Center by Dr. Christiana Pellant. She underwent ultrasound which demonstrated a large fluid collection in the upper outer quadrant of the right breast. 40 cc of yellow cloudy fluid was aspirated under ultrasound guidance. This was submitted to the laboratory for cultures.  Patient will remain on oral doxycycline. She is scheduled  to return to see Dr. Jamey Ripa in 4 days. Culture results should be available at that time.  Velora Heckler, MD, [facility] Office: [phone]

## 2012-04-09 ENCOUNTER — Telehealth (INDEPENDENT_AMBULATORY_CARE_PROVIDER_SITE_OTHER): Payer: Self-pay

## 2012-04-09 ENCOUNTER — Telehealth: Payer: Self-pay | Admitting: Radiation Oncology

## 2012-04-09 NOTE — Telephone Encounter (Signed)
LMOM that Dr Gerrit Friends wanted to know if pt better. We are awaiting culture to be faxed from [facility]. I have requested this result since it is not in epic.

## 2012-04-09 NOTE — Telephone Encounter (Signed)
Phoned patient to check status as promised. Patient reports that Friday she saw Dr. Gerrit Friends. Dr. Gerrit Friends obtained an ultrasound and extracted 40 cc of fluid from the patient's breast. Patient states, "they nearly killed my breast." Patient reports that she has continued to take the doxycycline ordered by Dr. Michell Heinrich but, is only taking it twice per day instead of three times. Patient's daughter is a Teacher, early years/pre and she directed her to only take it bid. Patient reports that most of her breast discomfort is during the night. Encouraged patient to take Motrin before bed and she verbalized understanding. Patient to follow up with Dr. Jamey Ripa on Tuesday for cytology results. Encouraged patient to call with needs and keep appointment for follow up with Dr. Michell Heinrich. Patient verbalized understanding of all things reviewed. Routed this message to Dr. Michell Heinrich.

## 2012-04-10 ENCOUNTER — Encounter (INDEPENDENT_AMBULATORY_CARE_PROVIDER_SITE_OTHER): Payer: Medicare Other | Admitting: Surgery

## 2012-04-10 ENCOUNTER — Encounter (INDEPENDENT_AMBULATORY_CARE_PROVIDER_SITE_OTHER): Payer: Self-pay | Admitting: Surgery

## 2012-04-10 ENCOUNTER — Ambulatory Visit (INDEPENDENT_AMBULATORY_CARE_PROVIDER_SITE_OTHER): Payer: Medicare Other | Admitting: Surgery

## 2012-04-10 VITALS — BP 122/78 | HR 70 | Temp 97.4°F | Resp 14 | Ht 62.5 in

## 2012-04-10 DIAGNOSIS — C50419 Malignant neoplasm of upper-outer quadrant of unspecified female breast: Secondary | ICD-10-CM

## 2012-04-10 DIAGNOSIS — N61 Mastitis without abscess: Secondary | ICD-10-CM

## 2012-04-10 MED ORDER — CEPHALEXIN 500 MG PO CAPS: 500.0000 mg | ORAL_CAPSULE | Freq: Four times a day (QID) | ORAL | Status: AC

## 2012-04-10 NOTE — Patient Instructions (Signed)
Change the outer dressing if it becomes saturated with fluid. Call us tomorrow if there are any problems. I need to see you on Friday to change the dressing and packing.

## 2012-04-10 NOTE — Progress Notes (Signed)
Chief complaint: Followup Cellulitis/infectionat lumpectomy site  History of present illness: This patient had a MammoSite for radiation therapy. She subsequently developed some erythema of the breast and a fluid collection at the MammoSite location. It was aspirated last week and she's been on antibiotics. She's continued to have a bright red breast and discomfort. The culture showed a staph, but not MRSA. She came back for followup today.  Exam: Vital signs: Breasts: The right breast is bright red. The entire breast appears involved with the erythema. The breast is very tender.  Impression: Cellulitis with probable infected seroma.  Plan: I discussed this with the patient and we elected to do this under local anesthesia. The area was anesthetized with 1% Xylocaine with epinephrine. Excellent anesthesia was obtained. The lateral one third of the prior scar was opened and a ceramic cavity entered. Fluid is primarily clear. The cavity was drained completely and a corner of a 4 x 4 gauze placed in a Z-Pak. She'll dressing was applied.  I will start her on Keflex 500 4 times a day and stop the doxycycline since the cellulitis has persisted on the doxycycline. She'll change the dressing as needed over the next day or 2 and we will see her back Friday to recheck for packing.

## 2012-04-11 ENCOUNTER — Telehealth: Payer: Self-pay | Admitting: Radiation Oncology

## 2012-04-11 NOTE — Telephone Encounter (Signed)
Phoned patient at home number listed in demographics to check status follow appointment with Dr. Jamey Ripa. Patient reviewed events of appointment with Streck then, reports she was prescribe Keflex four times per day. Patient states,"My breast feels much better this morning than it did last night." Patient to follow up with Streck again Friday morning. Informed patient this writer would call her Friday afternoon for an update. Encouraged patient to contact staff with needs and she verbalized understanding.

## 2012-04-13 ENCOUNTER — Ambulatory Visit (INDEPENDENT_AMBULATORY_CARE_PROVIDER_SITE_OTHER): Payer: Medicare Other | Admitting: Surgery

## 2012-04-13 ENCOUNTER — Telehealth: Payer: Self-pay | Admitting: Radiation Oncology

## 2012-04-13 ENCOUNTER — Encounter (INDEPENDENT_AMBULATORY_CARE_PROVIDER_SITE_OTHER): Payer: Self-pay | Admitting: Surgery

## 2012-04-13 VITALS — BP 108/74 | HR 64 | Temp 97.0°F | Resp 12 | Ht 62.0 in

## 2012-04-13 DIAGNOSIS — Z09 Encounter for follow-up examination after completed treatment for conditions other than malignant neoplasm: Secondary | ICD-10-CM

## 2012-04-13 NOTE — Progress Notes (Signed)
Patient comes back for followup of her breast stroma which I opened and drained 2 days ago. She feels that she is doing well from that and has not needed to change the dressing.  Exam: Vital signs:BP 108/74  Pulse 64  Temp 97 F (36.1 C) (Temporal)  Resp 12  Ht 5\' 2"  (1.575 m) Breast: The marked erythema is dramatically improved today. She is less tender. There is only been serous drainage.  Impression: Improving status post drainage of seroma with apparent breast cellulitis  Plan: She'll come back alternate days next week to have the wound repacked and I will see her in about 10 days.

## 2012-04-13 NOTE — Patient Instructions (Signed)
Come back next week on Monday, WEednesday and Friday to have your dressing changed. I will see you the following week

## 2012-04-13 NOTE — Telephone Encounter (Signed)
Phoned patient as promised to check status following follow up with Streck. Patient states,"I am better of at least that is what Dr. Jamey Ripa says." Patient reports that her breast has been hurting "ever since he pulled the pack out" and she plans to go home to take some Tylenol. Patient reports she was directed to continue Keflex and return to [facility] office on Monday for dressing change. Patient express desire to cancel follow up with Michell Heinrich since she is already seeing Streck. Encouraged patient to take antibiotics as directed and call with future needs. Encouraged patient not to overdo it and allow time for healing and rest. Patient verbalized understanding of all things reviewed. Routed message to Dr. Michell Heinrich.

## 2012-04-16 ENCOUNTER — Encounter (INDEPENDENT_AMBULATORY_CARE_PROVIDER_SITE_OTHER): Payer: Self-pay

## 2012-04-16 ENCOUNTER — Ambulatory Visit (INDEPENDENT_AMBULATORY_CARE_PROVIDER_SITE_OTHER): Payer: Medicare Other | Admitting: General Surgery

## 2012-04-16 VITALS — BP 108/70 | HR 68 | Temp 97.6°F | Resp 16 | Ht 64.0 in

## 2012-04-16 DIAGNOSIS — Z09 Encounter for follow-up examination after completed treatment for conditions other than malignant neoplasm: Secondary | ICD-10-CM

## 2012-04-16 NOTE — Progress Notes (Signed)
Subjective:     Patient ID: Joy Owens, female   DOB: 23-Jun-1941, 71 y.o.   MRN: [id number]  HPI 42 yof patient of Dr Weldon Inches who was here for nurse only visit for packing change and I was asked to look at wound.  She had lumpectomy followed by mammosite. She then had infected seroma that was first drained then opened.  This area is still somewhat red. She denies fevers. She is getting dressing changes in the office.  Review of Systems     Objective:   Physical Exam    right breast with open wound and serous drainage, the cavity is open, there is some redness inferior to this Assessment:     S/p mammosite with infected seroma    Plan:     Continue dressing changes Dr. Jamey Ripa to see this week to evaluate redness which may be improving

## 2012-04-17 ENCOUNTER — Telehealth: Payer: Self-pay | Admitting: Radiation Oncology

## 2012-04-17 ENCOUNTER — Ambulatory Visit: Payer: Medicare Other | Admitting: Radiation Oncology

## 2012-04-17 NOTE — Telephone Encounter (Signed)
Opened in error

## 2012-04-17 NOTE — Telephone Encounter (Signed)
Patient had yet to return call. Called again to check status. Patient answered. Patient reports she is scheduled to see Dr. Jamey Ripa tomorrow because the nurse who saw her Monday for dressing change and Dr. Dwain Sarna are concerned that more fluid has collected. Will call patient on Thursday to follow up. Instructed patient that 3 pm appointment with Dr. Michell Heinrich has been cancelled as she requested. Encouraged patient to follow up with Dr. Michell Heinrich August 22 at 1010. Encouraged patient to call with any needs. Patient verbalized understanding. Routed this message to Dr. Michell Heinrich.

## 2012-04-17 NOTE — Telephone Encounter (Signed)
Phoned patient to check status and inform her of one month follow up appointment. No answer. Left message requesting return call.

## 2012-04-18 ENCOUNTER — Encounter (INDEPENDENT_AMBULATORY_CARE_PROVIDER_SITE_OTHER): Payer: Medicare Other

## 2012-04-18 ENCOUNTER — Encounter (INDEPENDENT_AMBULATORY_CARE_PROVIDER_SITE_OTHER): Payer: Self-pay | Admitting: Surgery

## 2012-04-18 ENCOUNTER — Ambulatory Visit (INDEPENDENT_AMBULATORY_CARE_PROVIDER_SITE_OTHER): Payer: Medicare Other | Admitting: Surgery

## 2012-04-18 VITALS — BP 134/86 | HR 64 | Temp 97.1°F | Resp 20 | Ht 62.5 in

## 2012-04-18 DIAGNOSIS — Z09 Encounter for follow-up examination after completed treatment for conditions other than malignant neoplasm: Secondary | ICD-10-CM

## 2012-04-18 NOTE — Patient Instructions (Signed)
Have the nurse change your dressing Friday and Monday and see me Wednesday

## 2012-04-18 NOTE — Progress Notes (Signed)
Chief complaint: Followup infected seroma right breast lumpectomy site  History of present illness: This patient had a seroma develop in her lumpectomy site following MammoSite radiation. She had what looked like a cellulitis of this was opened several days ago. She comes in today for wound check. She does note a little bit of what she thinks is swelling up towards the axillary area.  Exam: Vital signs:BP 134/86  Pulse 64  Temp 97.1 F (36.2 C) (Temporal)  Resp 20  Ht 5' 2.5" (1.588 m) Breasts: There is still some mild erythema of the breast but dramatically improved from prior to having the area opened. I removed the packing in there still some serous drainage. The cavity is about 3-4 cm deep but fairly small in diameter. I replaced the PAC with a wick.  Impression: Improving  Plan: Continue antibiotics and dressing changes here with repacking on Friday and Monday and I will see her next Wednesday

## 2012-04-20 ENCOUNTER — Encounter: Payer: Medicare Other | Admitting: Physical Therapy

## 2012-04-20 ENCOUNTER — Ambulatory Visit (INDEPENDENT_AMBULATORY_CARE_PROVIDER_SITE_OTHER): Payer: Medicare Other | Admitting: General Surgery

## 2012-04-20 DIAGNOSIS — Z4800 Encounter for change or removal of nonsurgical wound dressing: Secondary | ICD-10-CM

## 2012-04-20 NOTE — Patient Instructions (Signed)
Keep nurse appts next week.

## 2012-04-20 NOTE — Progress Notes (Signed)
Patient comes in status post lumpectomy with mammosite radiation with open breast wound. Area was unpacked and repacked with quarter inch iodoform gauze and covered with dry gauze. Appts made for follow up dressing changes Monday, Wednesday and Friday. She will call with any problems prior.

## 2012-04-23 ENCOUNTER — Ambulatory Visit (INDEPENDENT_AMBULATORY_CARE_PROVIDER_SITE_OTHER): Payer: Medicare Other | Admitting: General Surgery

## 2012-04-23 VITALS — Temp 97.8°F

## 2012-04-23 DIAGNOSIS — Z4800 Encounter for change or removal of nonsurgical wound dressing: Secondary | ICD-10-CM

## 2012-04-23 NOTE — Progress Notes (Signed)
Patient comes in status post lumpectomy with mammosite radiation with open breast wound. Area was unpacked and repacked with quarter inch iodoform gauze and covered with dry gauze. She will call with any problems prior to appt on Wednesday.

## 2012-04-23 NOTE — Patient Instructions (Signed)
Keep appts for dressing changes.

## 2012-04-25 ENCOUNTER — Encounter (INDEPENDENT_AMBULATORY_CARE_PROVIDER_SITE_OTHER): Payer: Medicare Other | Admitting: Surgery

## 2012-04-25 ENCOUNTER — Ambulatory Visit (INDEPENDENT_AMBULATORY_CARE_PROVIDER_SITE_OTHER): Payer: Medicare Other | Admitting: General Surgery

## 2012-04-25 DIAGNOSIS — Z4800 Encounter for change or removal of nonsurgical wound dressing: Secondary | ICD-10-CM

## 2012-04-25 NOTE — Patient Instructions (Signed)
Patient comes in today with open right breast wound. Area was unpacked. It looks good. Redness on breast is going down and no warmth on breast. Area was repacked with 1/4 inch iodoform and covered with dry gauze. Patient to return on Friday for another dressing change.

## 2012-04-27 ENCOUNTER — Ambulatory Visit (INDEPENDENT_AMBULATORY_CARE_PROVIDER_SITE_OTHER): Payer: Medicare Other

## 2012-04-27 DIAGNOSIS — C50919 Malignant neoplasm of unspecified site of unspecified female breast: Secondary | ICD-10-CM

## 2012-04-27 NOTE — Progress Notes (Signed)
Pt came in for nurse only to have dressing change. The breast packing was removed and repacked with 1/4 packing. The area recovered with dry gauze. The pt not sure when her next nurse only visit will be since she normally comes in on Monday but she has a f/u appt with Dr Jamey Ripa and she really doesn't want to come 2 days in a row. I told the pt that I would leave Lesly Rubenstein a message and have her to call her on Monday am to let her know.

## 2012-04-30 ENCOUNTER — Telehealth (INDEPENDENT_AMBULATORY_CARE_PROVIDER_SITE_OTHER): Payer: Self-pay | Admitting: General Surgery

## 2012-04-30 NOTE — Telephone Encounter (Signed)
Message copied by Liliana Cline on Mon Apr 30, 2012  8:57 AM ------      Message from: Ethlyn Gallery      Created: Fri Apr 27, 2012  3:58 PM       Pt came in for nurse only today but she was questioning her next nurse visit b/c she normally comes Mon,Wed,and Friday but she has a f/u appt with Dr Jamey Ripa on 7/23. Can she skip Monday and just see him on Tuesday. Pls call her on Monday. AHS

## 2012-04-30 NOTE — Telephone Encounter (Signed)
Patient aware to keep appt tomorrow. She does not need to come in today. Let patient know it would be good to change the outside dressing, but she states she does not want to mess with it. She will keep appt with Dr Jamey Ripa tomorrow.

## 2012-05-01 ENCOUNTER — Encounter (INDEPENDENT_AMBULATORY_CARE_PROVIDER_SITE_OTHER): Payer: Self-pay | Admitting: Surgery

## 2012-05-01 ENCOUNTER — Ambulatory Visit (INDEPENDENT_AMBULATORY_CARE_PROVIDER_SITE_OTHER): Payer: Medicare Other | Admitting: Surgery

## 2012-05-01 VITALS — BP 127/80 | HR 82 | Temp 97.9°F | Resp 16

## 2012-05-01 DIAGNOSIS — Z09 Encounter for follow-up examination after completed treatment for conditions other than malignant neoplasm: Secondary | ICD-10-CM

## 2012-05-01 NOTE — Progress Notes (Signed)
Chief complaint: Followup infected seroma right breast lumpectomy site  History of present illness: This patient had a seroma develop in her lumpectomy site following MammoSite radiation. She had what looked like a cellulitis of this was opened several days ago. She comes in today for wound check. She does note a little bit of what she thinks is swelling up towards the axillary area.  Exam: Vital signs:BP 127/80  Pulse 82  Temp 97.9 F (36.6 C) (Temporal)  Resp 16 Breasts: There is still some mild erythema of the breast but dramatically improved from prior to having the area opened. I removed the packing in there still some serous drainage. The cavity is about 3-4 cm deep but fairly small in diameter. I replaced the PAC with a wick.  Impression: Improving  Plan: Continue dressing changes twice weekly and see me in two weeks

## 2012-05-01 NOTE — Patient Instructions (Signed)
Continue having the packing changed twice a week. I'll see you again in about 2 or 3 weeks.

## 2012-05-03 ENCOUNTER — Ambulatory Visit (INDEPENDENT_AMBULATORY_CARE_PROVIDER_SITE_OTHER): Payer: Medicare Other | Admitting: General Surgery

## 2012-05-03 DIAGNOSIS — Z4801 Encounter for change or removal of surgical wound dressing: Secondary | ICD-10-CM

## 2012-05-03 NOTE — Patient Instructions (Signed)
Keep follow up appts 

## 2012-05-03 NOTE — Progress Notes (Signed)
Patient comes in today with open right breast wound. Area was unpacked. It looks good. Area was repacked with 1/4 inch iodoform and covered with dry gauze. Patient to return on Monday for another dressing change.    

## 2012-05-07 ENCOUNTER — Ambulatory Visit (INDEPENDENT_AMBULATORY_CARE_PROVIDER_SITE_OTHER): Payer: Medicare Other

## 2012-05-07 DIAGNOSIS — N61 Mastitis without abscess: Secondary | ICD-10-CM

## 2012-05-07 NOTE — Progress Notes (Signed)
Pt here today for dressing change of R breast.  Still slight erythema around the breast which I marked with small dots.  Pt was asked to keep an eye on her breast, and if the redness did not lessen or got worse before her next visit on 8/1 to call our office.  The wound is still draining, and was repacked with 1/4" NuGauze and covered with clean gauze.

## 2012-05-10 ENCOUNTER — Ambulatory Visit (INDEPENDENT_AMBULATORY_CARE_PROVIDER_SITE_OTHER): Payer: Medicare Other | Admitting: General Surgery

## 2012-05-10 DIAGNOSIS — Z4801 Encounter for change or removal of surgical wound dressing: Secondary | ICD-10-CM

## 2012-05-10 NOTE — Patient Instructions (Signed)
Follow up Monday

## 2012-05-10 NOTE — Progress Notes (Signed)
Patient comes in today with open right breast wound. Area was unpacked. It looks good. Area was repacked with 1/4 inch iodoform and covered with dry gauze. Patient to return on Monday for another dressing change.

## 2012-05-14 ENCOUNTER — Encounter (INDEPENDENT_AMBULATORY_CARE_PROVIDER_SITE_OTHER): Payer: Self-pay

## 2012-05-14 ENCOUNTER — Ambulatory Visit (INDEPENDENT_AMBULATORY_CARE_PROVIDER_SITE_OTHER): Payer: Medicare Other | Admitting: General Surgery

## 2012-05-14 VITALS — BP 124/80 | HR 64 | Temp 97.8°F | Resp 16 | Ht 62.5 in

## 2012-05-14 DIAGNOSIS — IMO0001 Reserved for inherently not codable concepts without codable children: Secondary | ICD-10-CM

## 2012-05-16 ENCOUNTER — Encounter (INDEPENDENT_AMBULATORY_CARE_PROVIDER_SITE_OTHER): Payer: Self-pay | Admitting: Surgery

## 2012-05-16 ENCOUNTER — Ambulatory Visit (INDEPENDENT_AMBULATORY_CARE_PROVIDER_SITE_OTHER): Payer: Medicare Other | Admitting: Surgery

## 2012-05-16 VITALS — BP 118/80 | HR 72 | Resp 20

## 2012-05-16 DIAGNOSIS — Z09 Encounter for follow-up examination after completed treatment for conditions other than malignant neoplasm: Secondary | ICD-10-CM

## 2012-05-16 NOTE — Progress Notes (Signed)
Chief complaint: Followup infected seroma right breast lumpectomy site  History of present illness: This patient had a seroma develop in her lumpectomy site following MammoSite radiation. She had what looked like a cellulitis of this was opened several days ago. She comes in today for wound check. She does note a little bit of what she thinks is swelling up towards the axillary area.  Exam: Vital signs:BP 118/80  Pulse 72  Resp 20 Breasts: There is still some mild erythema of the breast but dramatically improved from prior to having the area opened. I removed the packing in there still some serous drainage. The cavity is about 2-3 cm deep but fairly small in diameter. I replaced the PAC with a wick.  Impression: Improving  Plan: Continue dressing changes twice weekly and see me in two weeks

## 2012-05-16 NOTE — Patient Instructions (Signed)
See me again in 2 weeks. Continue dressing changes here as you have been doing twice a week.

## 2012-05-18 ENCOUNTER — Ambulatory Visit (INDEPENDENT_AMBULATORY_CARE_PROVIDER_SITE_OTHER): Payer: Medicare Other | Admitting: General Surgery

## 2012-05-18 DIAGNOSIS — Z4800 Encounter for change or removal of nonsurgical wound dressing: Secondary | ICD-10-CM

## 2012-05-18 NOTE — Patient Instructions (Signed)
I will see you on Monday

## 2012-05-18 NOTE — Progress Notes (Signed)
Patient comes in today with open right breast wound. Area was unpacked. It looks good. Area was repacked with 1/4 inch iodoform and covered with dry gauze. Patient to return on Tuesday for another dressing change.

## 2012-05-22 ENCOUNTER — Ambulatory Visit (INDEPENDENT_AMBULATORY_CARE_PROVIDER_SITE_OTHER): Payer: Medicare Other | Admitting: General Surgery

## 2012-05-22 DIAGNOSIS — Z4800 Encounter for change or removal of nonsurgical wound dressing: Secondary | ICD-10-CM

## 2012-05-22 NOTE — Patient Instructions (Signed)
Keep appt on Friday

## 2012-05-22 NOTE — Progress Notes (Signed)
Patient comes in today with open right breast wound. Area was unpacked. It looks good. Area was repacked with 1/4 inch iodoform and covered with dry gauze. Patient to return on Friday for another dressing change.

## 2012-05-23 ENCOUNTER — Telehealth: Payer: Self-pay | Admitting: Radiation Oncology

## 2012-05-23 NOTE — Telephone Encounter (Signed)
Attempted to return call. No answer. Left message requesting return call. Will attempt to reach patient again tomorrow.

## 2012-05-24 ENCOUNTER — Telehealth: Payer: Self-pay | Admitting: Radiation Oncology

## 2012-05-24 NOTE — Telephone Encounter (Signed)
Patient phoned earlier today requesting to cancel upcoming follow up with Dr. Michell Heinrich since she was being seen in Dr. Tenna Child office twice a week. Phoned patient to confirm that Dr. Michell Heinrich was "ok" with cancelling August 22 follow up but, that she does want her to make a follow up appointment "to touch base" once she is healed. Stressed to the patient  to call office with any future needs. Expressed Dr. Luciano Cutter concern and desire to provide support to all her patients. Patient verbalized understanding of all reviewed and expressed appreciation for the call.

## 2012-05-25 ENCOUNTER — Encounter (INDEPENDENT_AMBULATORY_CARE_PROVIDER_SITE_OTHER): Payer: Medicare Other

## 2012-05-29 ENCOUNTER — Ambulatory Visit (INDEPENDENT_AMBULATORY_CARE_PROVIDER_SITE_OTHER): Payer: Medicare Other

## 2012-05-29 ENCOUNTER — Encounter (INDEPENDENT_AMBULATORY_CARE_PROVIDER_SITE_OTHER): Payer: Medicare Other | Admitting: Surgery

## 2012-05-29 VITALS — BP 108/78 | HR 68 | Resp 16

## 2012-05-29 DIAGNOSIS — IMO0001 Reserved for inherently not codable concepts without codable children: Secondary | ICD-10-CM

## 2012-05-29 DIAGNOSIS — Z4800 Encounter for change or removal of nonsurgical wound dressing: Secondary | ICD-10-CM

## 2012-05-29 NOTE — Progress Notes (Signed)
Patient came in today for dressing & packing change, patient concerned about some discomfort she has recently been experiencing near the drain incision site.  Dr. Magnus Ivan examined patient and felt she should be placed on an antibiotic.  RX written for Keflex. Patient instructed to call our office if she develops a fever or an increase in swelling, redness, drainage or if area becomes warm to touch.   Patient has follow up appointment with Dr. Jamey Ripa on 06/01/12 @ 11:00

## 2012-05-30 ENCOUNTER — Other Ambulatory Visit (INDEPENDENT_AMBULATORY_CARE_PROVIDER_SITE_OTHER): Payer: Self-pay | Admitting: Surgery

## 2012-05-31 ENCOUNTER — Ambulatory Visit: Payer: Medicare Other | Admitting: Radiation Oncology

## 2012-06-01 ENCOUNTER — Encounter (INDEPENDENT_AMBULATORY_CARE_PROVIDER_SITE_OTHER): Payer: Self-pay | Admitting: Surgery

## 2012-06-01 ENCOUNTER — Ambulatory Visit (INDEPENDENT_AMBULATORY_CARE_PROVIDER_SITE_OTHER): Payer: Medicare Other | Admitting: Surgery

## 2012-06-01 VITALS — BP 124/86 | HR 62 | Temp 97.5°F | Ht 62.5 in

## 2012-06-01 DIAGNOSIS — Z9889 Other specified postprocedural states: Secondary | ICD-10-CM

## 2012-06-01 NOTE — Patient Instructions (Signed)
Go back to dressing change three times weekly, continue the antibiotic and see me again in two weeks

## 2012-06-01 NOTE — Progress Notes (Signed)
Chief complaint: Followup infected seroma right breast lumpectomy site  History of present illness: This patient had a seroma develop in her lumpectomy site following MammoSite radiation. She had what looked like a cellulitis of this was opened several days ago. She comes in today for wound check.She started some Keflex a few days ago.  Exam: Vital signs:BP 124/86  Pulse 62  Temp 97.5 F (36.4 C) (Temporal)  Ht 5' 2.5" (1.588 m) Breasts:The cavity is about 2-3 cm deep but fairly small in diameter.It is no smaller than two weeks ago Impression: Improving  Plan: Continue dressing changes three timesweekly and see me in two weeks. Will use plain wicks instead of iodoform in case the iodoform is causing some issues with the healing process

## 2012-06-04 ENCOUNTER — Ambulatory Visit (INDEPENDENT_AMBULATORY_CARE_PROVIDER_SITE_OTHER): Payer: Medicare Other

## 2012-06-04 VITALS — BP 102/70 | HR 71 | Temp 97.0°F | Resp 16

## 2012-06-04 DIAGNOSIS — IMO0001 Reserved for inherently not codable concepts without codable children: Secondary | ICD-10-CM

## 2012-06-04 DIAGNOSIS — Z4800 Encounter for change or removal of nonsurgical wound dressing: Secondary | ICD-10-CM

## 2012-06-04 NOTE — Progress Notes (Signed)
Joy Owens comes into the office for packing and dressing change.  Drain site continues to drain, area has mild redness, patient continues to take Keflex.  1/4 plain packing strip gauze and dress gauze placed over drain/incision site.  Patient tolerated well.  Joy Owens has a follow up nurse visit on 06/06/12 @ 4:30 pm and 06/08/12 @ 4:30 pm.

## 2012-06-06 ENCOUNTER — Ambulatory Visit (INDEPENDENT_AMBULATORY_CARE_PROVIDER_SITE_OTHER): Payer: Medicare Other

## 2012-06-06 DIAGNOSIS — C50919 Malignant neoplasm of unspecified site of unspecified female breast: Secondary | ICD-10-CM

## 2012-06-06 NOTE — Progress Notes (Signed)
Pt came in for nurse only to have breast wound repacked. The dressing was removed along with the packing. The breast looks fine with little redness. The pt took her last antibiotic pill today. The area was repacked with 1/4 plain packing and dry dressing recovered the breast. The pt will be back on Friday for the other nurse visit.

## 2012-06-08 ENCOUNTER — Ambulatory Visit (INDEPENDENT_AMBULATORY_CARE_PROVIDER_SITE_OTHER): Payer: Medicare Other | Admitting: General Surgery

## 2012-06-08 DIAGNOSIS — Z4800 Encounter for change or removal of nonsurgical wound dressing: Secondary | ICD-10-CM

## 2012-06-08 NOTE — Patient Instructions (Addendum)
Keep follow up on Tuesday

## 2012-06-08 NOTE — Progress Notes (Signed)
Patient comes in today with open right breast wound. Area was unpacked. It looks good, small amount of redness at incision but the rest of the breast shows no signs of redness. She finished antibiotics today. Area was repacked with 1/4 inch new gauze and covered with dry gauze. Patient to return on Tuesday for another dressing change, due to the office being closed on Monday. She will call with any problems.

## 2012-06-12 ENCOUNTER — Ambulatory Visit (INDEPENDENT_AMBULATORY_CARE_PROVIDER_SITE_OTHER): Payer: Medicare Other | Admitting: General Surgery

## 2012-06-12 DIAGNOSIS — Z4800 Encounter for change or removal of nonsurgical wound dressing: Secondary | ICD-10-CM

## 2012-06-12 NOTE — Patient Instructions (Addendum)
Follow up on Friday with Dr Jamey Ripa

## 2012-06-12 NOTE — Progress Notes (Signed)
Patient comes in status post right lumpectomy with mammosite radiation. Gauze and iodoform removed. Area is draining yellow drainage. Area was probed with Q-tip. Unable to pack area with iodoform. It was covered with dry gauze. Appt made for patient to follow up with Dr Jamey Ripa on Friday for a wound check. She will keep appt and call with any problems prior.

## 2012-06-15 ENCOUNTER — Encounter (INDEPENDENT_AMBULATORY_CARE_PROVIDER_SITE_OTHER): Payer: Medicare Other

## 2012-06-15 ENCOUNTER — Ambulatory Visit (INDEPENDENT_AMBULATORY_CARE_PROVIDER_SITE_OTHER): Payer: Medicare Other | Admitting: Surgery

## 2012-06-15 ENCOUNTER — Encounter (INDEPENDENT_AMBULATORY_CARE_PROVIDER_SITE_OTHER): Payer: Self-pay | Admitting: Surgery

## 2012-06-15 VITALS — BP 122/62 | HR 64 | Temp 98.0°F | Resp 18

## 2012-06-15 DIAGNOSIS — Z09 Encounter for follow-up examination after completed treatment for conditions other than malignant neoplasm: Secondary | ICD-10-CM

## 2012-06-15 NOTE — Progress Notes (Signed)
Chief complaint: Followup infected seroma right breast lumpectomy site  History of present illness: This patient had a seroma develop in her lumpectomy site following MammoSite radiation It becam infected and we have been doing dressing changes here. It has closed in such that we can no longer get a pack in.  Vital signs:BP 122/62  Pulse 64  Temp 98 F (36.7 C) (Temporal)  Resp 18  SpO2 99% Breasts:There is no residual cavity only a tiny open area at the site that we have been packing.  Impression: Improved  Plan: RTC next week if still draining, else in one month

## 2012-06-15 NOTE — Patient Instructions (Signed)
Its OK to shower. Just keep a band-aid over the area and change as needed. If it is still draining next week come back to see me again. Otherwise I would like to see you in a month.

## 2012-06-18 ENCOUNTER — Encounter (INDEPENDENT_AMBULATORY_CARE_PROVIDER_SITE_OTHER): Payer: Medicare Other

## 2012-06-19 ENCOUNTER — Encounter (INDEPENDENT_AMBULATORY_CARE_PROVIDER_SITE_OTHER): Payer: Medicare Other | Admitting: Surgery

## 2012-06-20 ENCOUNTER — Encounter (INDEPENDENT_AMBULATORY_CARE_PROVIDER_SITE_OTHER): Payer: Medicare Other | Admitting: Surgery

## 2012-07-12 ENCOUNTER — Ambulatory Visit (INDEPENDENT_AMBULATORY_CARE_PROVIDER_SITE_OTHER): Payer: Medicare Other | Admitting: Surgery

## 2012-07-12 ENCOUNTER — Encounter (INDEPENDENT_AMBULATORY_CARE_PROVIDER_SITE_OTHER): Payer: Self-pay | Admitting: Surgery

## 2012-07-12 VITALS — BP 112/76 | HR 72 | Temp 97.5°F | Resp 16 | Ht 62.5 in

## 2012-07-12 DIAGNOSIS — Z09 Encounter for follow-up examination after completed treatment for conditions other than malignant neoplasm: Secondary | ICD-10-CM

## 2012-07-12 NOTE — Progress Notes (Signed)
Chief complaint: Followup infected seroma right breast lumpectomy site  History of present illness: This patient had a seroma develop in her lumpectomy site following MammoSite radiation It became infected and we bid dressing changes until it finally closed over.at her last visit a month ago the area is essentially closed. She's had no significant drainage for the last 3 weeks.  Vital signs:BP 112/76  Pulse 72  Temp 97.5 F (36.4 C) (Temporal)  Resp 16  Ht 5' 2.5" (1.588 m) Breasts:lobectomy cavity is still a little bit tender. It appears to have healed over. There is some deformity of the skin secondary to the healing process.  Impression:resolved infection and seroma site. Plan: We'll see back again in December. She should have her mammogram in December prior to seeing me.

## 2012-07-12 NOTE — Patient Instructions (Signed)
See me again in December. Have your mammogram before you see me.

## 2012-09-17 ENCOUNTER — Ambulatory Visit
Admission: RE | Admit: 2012-09-17 | Discharge: 2012-09-17 | Disposition: A | Discharge status: Home / Self Care | Payer: Medicare Other | Source: Ambulatory Visit | Attending: Physician Assistant | Admitting: Physician Assistant

## 2012-09-17 DIAGNOSIS — C50419 Malignant neoplasm of upper-outer quadrant of unspecified female breast: Secondary | ICD-10-CM

## 2012-09-27 ENCOUNTER — Telehealth: Payer: Self-pay | Admitting: Oncology

## 2012-09-27 ENCOUNTER — Other Ambulatory Visit (HOSPITAL_BASED_OUTPATIENT_CLINIC_OR_DEPARTMENT_OTHER): Payer: Medicare Other | Admitting: Lab

## 2012-09-27 ENCOUNTER — Ambulatory Visit (HOSPITAL_BASED_OUTPATIENT_CLINIC_OR_DEPARTMENT_OTHER): Payer: Medicare Other | Admitting: Oncology

## 2012-09-27 VITALS — BP 129/84 | HR 57 | Temp 98.2°F | Resp 20

## 2012-09-27 DIAGNOSIS — C50419 Malignant neoplasm of upper-outer quadrant of unspecified female breast: Secondary | ICD-10-CM

## 2012-09-27 DIAGNOSIS — E559 Vitamin D deficiency, unspecified: Secondary | ICD-10-CM

## 2012-09-27 DIAGNOSIS — Z170 Estrogen receptor positive status [ER+]: Secondary | ICD-10-CM

## 2012-09-27 LAB — LACTATE DEHYDROGENASE (CC13): LDH: 164 U/L (ref 125–245)

## 2012-09-27 LAB — CBC WITH DIFFERENTIAL/PLATELET
BASO%: 1.3 % (ref 0.0–2.0)
EOS%: 4.9 % (ref 0.0–7.0)
LYMPH%: 33.3 % (ref 14.0–49.7)
MCHC: 35.5 g/dL (ref 31.5–36.0)
MCV: 88.1 fL (ref 79.5–101.0)
MONO#: 0.6 10*3/uL (ref 0.1–0.9)
MONO%: 10.2 % (ref 0.0–14.0)
Platelets: 240 10*3/uL (ref 145–400)
RBC: 4.31 10*6/uL (ref 3.70–5.45)
WBC: 5.5 10*3/uL (ref 3.9–10.3)

## 2012-09-27 NOTE — Telephone Encounter (Signed)
Pt given appt schedule for June 2014. Pt informed that PR will not be with [place] next and would like to chose her own provider. Pt would like to see KK/GM or even Dr. Mariel Sleet. For now pt is scheduled w/GM at her request and will call us if she changes her mind.

## 2012-09-27 NOTE — Progress Notes (Signed)
Hematology and Oncology Follow Up Visit  Joy Owens [id number] 1941/04/10 71 y.o. 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 Michell Heinrich, completed 02/10/2012. Arimidex since 02/24/12. She is having migratory aches and pains in her lt breast. She had a mammogram 09/17/12 of her rt breast, which was wnl.she did have a seroma  Of her rt breast which was drained.  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 She denies any fevers, chills or night sweats. No shortness of breath, or chest pain. No nausea, emesis, diarrhea or constipation.  She is experiencing hot flashes.  She denies joint sensitivity.  A detailed review of systems is otherwise noncontributory as noted below.  Review of Systems: Constitutional:  no weight loss, fever, night sweats and feels well Eyes: No complaints ENT: No complaints Cardiovascular: no chest pain or dyspnea on exertion Respiratory: no cough, shortness of breath, or wheezing Neurological: no TIA or stroke symptoms Dermatological: negative Gastrointestinal: no abdominal pain, change in bowel habits, or black or bloody stools Genito-Urinary: no dysuria, trouble voiding, or hematuria Hematological and Lymphatic: negative Breast: negative Musculoskeletal: negative Remaining ROS negative.   Medications:   I have reviewed the patient's current medications.  Current Outpatient Prescriptions  Medication Sig Dispense Refill  . anastrozole (ARIMIDEX) 1 MG  tablet Take 1 mg by mouth daily.      . cholecalciferol (VITAMIN D) 1000 UNITS tablet Take 1,000 Units by mouth daily.      Marland Kitchen MICARDIS HCT 40-12.5 MG per tablet 1 tablet daily. AM      . Multiple Vitamins-Minerals (MULTIVITAMIN PO) Take 1 tablet by mouth daily.      . sertraline (ZOLOFT) 25 MG tablet Take 25 mg by mouth daily. PM      . solifenacin (VESICARE) 5 MG tablet Take 5 mg by mouth daily. AM      . zolpidem (AMBIEN) 5 MG tablet Take 5 mg by mouth as needed.         Allergies:  Allergies  Allergen Reactions  . Sudafed (Pseudoephedrine Hcl)     Makes pt jittery    Physical Exam: Filed Vitals:   09/27/12 1536  BP: 129/84  Pulse: 57  Temp: 98.2 F (36.8 C)  Resp: 20    There is no height or weight on file to calculate BMI. HEENT:  Sclerae anicteric, conjunctivae pink.  Oropharynx clear.  No mucositis or candidiasis.   Nodes:  No cervical, supraclavicular, or axillary lymphadenopathy palpated.  Breast Exam:  The right breast was examined, there is much less redness the breast. Axilla is clear. Lungs:  Clear to auscultation bilaterally.  No crackles, rhonchi, or wheezes.   Heart:  Regular rate and rhythm.   Abdomen:  Soft, nontender.  Positive bowel sounds.  No organomegaly or masses palpated.   Musculoskeletal:  No focal spinal tenderness to palpation.  Extremities:  Benign.  No peripheral edema or cyanosis.   Skin:  Benign.   Neuro:  Nonfocal, alert and oriented x 3.   Lab Results: Lab Results  Component Value Date  WBC 5.5 09/27/2012   HGB 13.5 09/27/2012   HCT 38.0 09/27/2012   MCV 88.1 09/27/2012   PLT 240 09/27/2012   NEUTROABS 2.8 09/27/2012     Chemistry      Component Value Date/Time   NA 140 03/27/2012 1450   K 3.6 03/27/2012 1450   CL 101 03/27/2012 1450   CO2 28 03/27/2012 1450   BUN 26* 03/27/2012 1450   CREATININE 1.05 03/27/2012 1450      Component Value Date/Time   CALCIUM 9.8 03/27/2012 1450   ALKPHOS 61 03/27/2012 1450   AST 16 03/27/2012 1450    ALT 14 03/27/2012 1450   BILITOT 0.4 03/27/2012 1450      Lab Results  Component Value Date   LABCA2 14 01/11/2012     Assessment:  Patient is a 71 year old [place] woman with a history of an 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 Michell Heinrich, completed 02/10/2012. Initiation of Arimidex 1mg  daily on 02/24/12. She will have a f/u bilateral  mammogram 3/14.   Plan:  Patient will continue Arimidex 1 mg by  mouth daily.we will check a vitamin d level.f/u in 6 months. Kimoni Pickerill, md 09/27/2012

## 2012-09-28 ENCOUNTER — Encounter (INDEPENDENT_AMBULATORY_CARE_PROVIDER_SITE_OTHER): Payer: Self-pay | Admitting: Surgery

## 2012-09-28 ENCOUNTER — Ambulatory Visit (INDEPENDENT_AMBULATORY_CARE_PROVIDER_SITE_OTHER): Payer: Medicare Other | Admitting: Surgery

## 2012-09-28 VITALS — BP 118/82 | HR 64 | Temp 97.8°F | Resp 16 | Ht 62.0 in | Wt 169.0 lb

## 2012-09-28 DIAGNOSIS — C50419 Malignant neoplasm of upper-outer quadrant of unspecified female breast: Secondary | ICD-10-CM

## 2012-09-28 DIAGNOSIS — Z853 Personal history of malignant neoplasm of breast: Secondary | ICD-10-CM

## 2012-09-28 NOTE — Progress Notes (Signed)
NAME: [name]       DOB: June 03, 1941           DATE: 09/28/2012       MRN: [id number]   Joy Owens is a 72 y.o.Marland Kitchenfemale who presents for routine followup of her Stage I, right IDC, receptor+, her2 Neg, diagnosed in <arch 2013 and treated with lumpectomy, SLN, Mammosite radiation, and anti estrogen therapy. She has no problems or concerns on either side.  PFSH: She has had no significant changes since the last visit here.  ROS: There have been no significant changes since the last visit here  EXAM:  VS: BP 118/82  Pulse 64  Temp 97.8 F (36.6 C)  Resp 16  Ht 5\' 2"  (1.575 m)  Wt 169 lb (76.658 kg)  BMI 30.91 kg/m2  General: The patient is alert, oriented, generally healthy appearing, NAD. Mood and affect are normal.  Breasts:  right breast is a little smaller than the left. There is some dimpling at the lumpectomy site from the radiation and her prior infection afterwards. There is some firmness just superior to the incision. There is nothing to suggest a recurrence. The skin nipple areas looked normal. The left breast looks normal.    Lymphatics: She has no axillary or supraclavicular adenopathy on either side.  Extremities: Full ROM of the surgical side with no lymphedema noted.  Data Reviewed: Mammogram report and Oncology notes  Clinical Data: History of right breast cancer status post  lumpectomy and radiation therapy in April 2013. The patient  complains of right breast tenderness. There is no signs of  erythema or infection.  DIGITAL DIAGNOSTIC RIGHT MAMMOGRAM WITH CAD  Comparison: With priors  Findings:  Breast Density: ACR Category 2: There is a scattered  fibroglandular pattern.  There is no suspicious mass or malignant-type microcalcifications.  Lumpectomy changes are present.  Mammographic images were processed with CAD.  IMPRESSION:  No evidence of malignancy in the right breast.  RECOMMENDATION:  Bilateral diagnostic mammogram in March  2014 is recommended.  I have discussed the findings and recommendations with the patient.  Results were also provided in writing at the conclusion of the  visit.  BI-RADS CATEGORY 2: Benign finding(s).  Original Report Authenticated By: Baird Lyons, M.D.    Impression: Doing well, with no evidence of recurrent cancer or new cancer  Plan: RTC six months

## 2012-09-28 NOTE — Patient Instructions (Signed)
See me in six months 

## 2012-12-04 ENCOUNTER — Telehealth: Payer: Self-pay | Admitting: Oncology

## 2012-12-04 NOTE — Telephone Encounter (Signed)
I reviewed the pathology report from this patient's lumpectomy and her consult note by Dr. Donnie Coffin with Dr. Darnelle Catalan inquiring about the oncotype.  The patient had asked me why she did not have one.  Dr. Darnelle Catalan reviewed and concurred with Dr. Renelda Loma note that she did not need one because of her path- the grade, Ki67, and ER percentage etc.    I explained that to the patient.   I also called Maylon Cos, the genetic counselor and reviewed her risk factors of a grandmother with breast cancer over the age of 27 and she does not meet Medicare criteria.  I explained that to the patient but did offer to schedule her for a consult if she wanted to be tested and pay out of pocket.  The patient said "it is all about the money!"   I explained that we have national guidelines and the guidelines provide just that for physicians.   Care is individualized for the patient based on the guidelines.    She said she feels a little better.  She has my number is she has more quesitons.   She is scheduled to see Dr. Darnelle Catalan in June.

## 2012-12-04 NOTE — Telephone Encounter (Signed)
The patient called and left a message on my voicemail asking about tests that had been completed.   I returned her call and left a message with my direct number.

## 2012-12-11 ENCOUNTER — Telehealth (INDEPENDENT_AMBULATORY_CARE_PROVIDER_SITE_OTHER): Payer: Self-pay | Admitting: General Surgery

## 2012-12-11 NOTE — Telephone Encounter (Signed)
RX written and will fax when signed by Dr Jamey Ripa tomorrow.

## 2012-12-11 NOTE — Telephone Encounter (Signed)
Message copied by Liliana Cline on Tue Dec 11, 2012  3:29 PM ------      Message from: [place], [place]      Created: Fri Dec 07, 2012  4:13 PM        Olegario Messier with [facility] called to request a prescription for a right arm sleeve for the patient.  Fax number is [phone].                   Please let me know if you need me to do anything on this.                   Thanks         Haig Prophet        ------

## 2012-12-12 ENCOUNTER — Telehealth (INDEPENDENT_AMBULATORY_CARE_PROVIDER_SITE_OTHER): Payer: Self-pay | Admitting: General Surgery

## 2012-12-12 NOTE — Telephone Encounter (Signed)
Message copied by Liliana Cline on Wed Dec 12, 2012  1:40 PM ------      Message from: Currie Paris      Created: Wed Dec 12, 2012  1:20 PM      Regarding: FW: Right arm sleeve prescription       Can we send this from Epic??            ----- Message -----         From: Consuelo Pandy, RN         Sent: 12/07/2012  11:50 AM           To: Currie Paris, MD, Liliana Cline, CMA      Subject: Right arm sleeve prescription                            Olegario Messier with [facility] called to request a prescription for a right arm sleeve for the patient.  Fax number is [phone].            Please let me know if you need me to do anything on this.            Thanks      Haig Prophet       ------

## 2012-12-12 NOTE — Telephone Encounter (Signed)
Compression sleeve RX faxed to second to [place].

## 2013-01-02 ENCOUNTER — Other Ambulatory Visit: Payer: Self-pay | Admitting: Oncology

## 2013-01-02 DIAGNOSIS — Z853 Personal history of malignant neoplasm of breast: Secondary | ICD-10-CM

## 2013-01-14 ENCOUNTER — Ambulatory Visit
Admission: RE | Admit: 2013-01-14 | Discharge: 2013-01-14 | Disposition: A | Discharge status: Home / Self Care | Payer: Medicare Other | Source: Ambulatory Visit | Attending: Oncology | Admitting: Oncology

## 2013-01-14 DIAGNOSIS — Z853 Personal history of malignant neoplasm of breast: Secondary | ICD-10-CM

## 2013-02-25 ENCOUNTER — Other Ambulatory Visit: Payer: Self-pay | Admitting: *Deleted

## 2013-02-25 DIAGNOSIS — C50419 Malignant neoplasm of upper-outer quadrant of unspecified female breast: Secondary | ICD-10-CM

## 2013-02-25 MED ORDER — ANASTROZOLE 1 MG PO TABS: 1.0000 mg | ORAL_TABLET | Freq: Every day | ORAL | Status: DC

## 2013-03-01 ENCOUNTER — Other Ambulatory Visit: Payer: Self-pay | Admitting: *Deleted

## 2013-03-15 ENCOUNTER — Encounter (INDEPENDENT_AMBULATORY_CARE_PROVIDER_SITE_OTHER): Payer: Self-pay | Admitting: Surgery

## 2013-03-15 ENCOUNTER — Ambulatory Visit (INDEPENDENT_AMBULATORY_CARE_PROVIDER_SITE_OTHER): Payer: Medicare Other | Admitting: Surgery

## 2013-03-15 VITALS — BP 100/70 | HR 60 | Temp 97.8°F | Resp 18 | Ht 62.5 in | Wt 168.5 lb

## 2013-03-15 DIAGNOSIS — Z853 Personal history of malignant neoplasm of breast: Secondary | ICD-10-CM

## 2013-03-15 NOTE — Progress Notes (Signed)
NAME: Joy Owens       DOB: 07/08/1941           DATE: 03/15/2013       MRN: [id number]   [name] is a 72 y.o.Mar[place] Kitchenfemale who presents for routine followup of her Stage I, right IDC, receptor+, her2 Neg, diagnosed in March 2013 and treated with lumpectomy, SLN, Mammosite radiation, and anti estrogen therapy. She has no problems or concerns on either side, other than some persistent lymphedema in the right axillary area  PFSH: She has had no significant changes since the last visit here.  ROS: There have been no significant changes since the last visit here  EXAM:  VS: BP 100/70  Pulse 60  Temp(Src) 97.8 F (36.6 C) (Oral)  Resp 18  Ht 5' 2.5" (1.588 m)  Wt 168 lb 8 oz (76.431 kg)  BMI 30.31 kg/m2  General: The patient is alert, oriented, generally healthy appearing, NAD. Mood and affect are normal.  Breasts:  right breast is a little smaller than the left. There is some dimpling at the lumpectomy site from the radiation and her prior infection afterwards. There is some firmness just superior to the incision, but seems softer than beforeThere is nothing to suggest a recurrence. The skin nipple areas looked normal. The left breast looks normal.    Lymphatics: She has no axillary or supraclavicular adenopathy on either side.  Extremities: Full ROM of the surgical side with no lymphedema noted.  Data Reviewed: Mammogram [id number]: Findings:  ACR Breast Density Category 2: There is a scattered fibroglandular  pattern.  Lumpectomy changes are seen in the left breast. There is no  suspicious mass or malignant-type microcalcifications in either  breast.  Mammographic images were processed with CAD.  IMPRESSION:  No evidence of malignancy in either breast.  RECOMMENDATION:  Diagnostic mammogram in 1 year is recommended.  I have discussed the findings and recommendations with the patient.  Results were also provided in writing at the conclusion of the  visit. If  applicable, a reminder letter will be sent to the  patient regarding her next appointment.  BI-RADS CATEGORY 2: Benign finding(s).  Original Report Authenticated By: Baird Lyons, M.D.   Clinical Data: History of right breast cancer status post  lumpectomy and radiation therapy in April 2013. The patient  complains of right breast tenderness. There is no signs of  erythema or infection.  DIGITAL DIAGNOSTIC RIGHT MAMMOGRAM WITH CAD  Comparison: With priors  Findings:  Breast Density: ACR Category 2: There is a scattered  fibroglandular pattern.  There is no suspicious mass or malignant-type microcalcifications.  Lumpectomy changes are present.  Mammographic images were processed with CAD.  IMPRESSION:  No evidence of malignancy in the right breast.  RECOMMENDATION:  Bilateral diagnostic mammogram in March 2014 is recommended.  I have discussed the findings and recommendations with the patient.  Results were also provided in writing at the conclusion of the  visit.  BI-RADS CATEGORY 2: Benign finding(s).  Original Report Authenticated By: Baird Lyons, M.D.    Impression: Doing well, with no evidence of recurrent cancer or new cancer  Plan: RTC six months

## 2013-03-15 NOTE — Patient Instructions (Signed)
Continue annual mammograms See me in six months

## 2013-03-25 ENCOUNTER — Other Ambulatory Visit: Payer: Medicare Other

## 2013-03-27 ENCOUNTER — Encounter: Payer: Self-pay | Admitting: Oncology

## 2013-03-27 NOTE — Progress Notes (Signed)
Returned pt's phone call.  Left vm for her to return my call.

## 2013-04-01 ENCOUNTER — Telehealth: Payer: Self-pay | Admitting: Oncology

## 2013-04-01 ENCOUNTER — Ambulatory Visit (HOSPITAL_BASED_OUTPATIENT_CLINIC_OR_DEPARTMENT_OTHER): Payer: Medicare Other | Admitting: Oncology

## 2013-04-01 VITALS — BP 133/81 | HR 64 | Temp 97.9°F | Resp 20 | Ht 62.5 in | Wt 174.6 lb

## 2013-04-01 DIAGNOSIS — C50911 Malignant neoplasm of unspecified site of right female breast: Secondary | ICD-10-CM | POA: Insufficient documentation

## 2013-04-01 DIAGNOSIS — C50419 Malignant neoplasm of upper-outer quadrant of unspecified female breast: Secondary | ICD-10-CM

## 2013-04-01 DIAGNOSIS — C50411 Malignant neoplasm of upper-outer quadrant of right female breast: Secondary | ICD-10-CM

## 2013-04-01 DIAGNOSIS — C50919 Malignant neoplasm of unspecified site of unspecified female breast: Secondary | ICD-10-CM

## 2013-04-01 MED ORDER — ANASTROZOLE 1 MG PO TABS: 1.0000 mg | ORAL_TABLET | Freq: Every day | ORAL | Status: DC

## 2013-04-01 NOTE — Progress Notes (Signed)
ID: Melina Schools Grubb OB: [birth year]  MR#: [id number]  CSN#:[id number]  PCP: Astrid Divine, MD GYN: Marcelle Overlie SU: Cicero Duck  OTHER MD: Wendall Papa, Lunette Stands,  Christina  Haverstock   HISTORY OF PRESENT ILLNESS:  From Dr. Theron Arista Rubin's intake note 01/30/2012:  "This patient is the previous good health. She has undergone annual screening mammography. Screening mammogram from 12/21/2011 showed a possible mass in the right breast. She presented on a mammogram in November of 2012 because of a small nodule seen in the right breast. This is felt to be exacerbation cysts. On the current mammogram no other abnormalities were seen. The patient was referred for biopsy. 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 Only a bright T2 lesion midthoracic spine is felt to be a hemangioma."    Her subsequent history ios as detailed below  INTERVAL HISTORY: Vora was seen at the breast cancer clinic 04/01/2013. She is establishing herself in my care today.  REVIEW OF SYSTEMS: We went over her history and she tells me she had significant difficulties associated with her MammoSite therapy and still has a little bit are of irregularity in her right breast associated with that treatment. Her worse problem at present is insomnia, and she is Ambien every night with very little effect. She has stress urinary incontinence and a spastic bladder. She feels anxious but not depressed. She still having problems with hot flashes. She tells me her "prediabetes" is controlled with diet and exercise. She is having no hot flashes and no vaginal dryness associated with the anastrozole. She is less than $10 a month for this drug. She thinks she  sometimes has a little bit of right upper extremity lymphedema but does not think she needs to go back to the lymphedema clinic or where a sleeve at this point. She does the "[facility]" program at [facility]. A detailed review of systems today was otherwise noncontributory  PAST MEDICAL HISTORY: Past Medical History  Diagnosis Date  . Osteopenia   . Anxiety     situational  . Cataract immature   . Hypertension     under control, has been on med. x 10 yrs.  . Diabetes mellitus     diet-controlled  . Seasonal allergies   . Overactive bladder   . Arthritis     hand  . Abrasion of finger 01/24/2012  . Cancer 01/2012    right breast  . Complication of anesthesia 1973    prolonged emergence and no memory of events x 5 days    PAST SURGICAL HISTORY: Past Surgical History  Procedure Laterality Date  . Colonoscopy  2003, 2008, 10/2011  . Polypectomy  2003     with colonoscopy  . Tonsillectomy  1953  . Tubal ligation  1973  . Laparoscopic assisted vaginal hysterectomy  12/01/2009    with BSO, posterior repair  . Dilation and curettage of uterus  01/31/2001    with hysteroscopy  . Cataract extraction      x 1  . Breast mammosite  01/30/2012    Procedure: MAMMOSITE BREAST;  Surgeon: Currie Paris, MD;  Location: [facility];  Service: General;  Laterality: Right;   mammosite placement  . Breast surgery    .  Breast lumpectomy  01/30/2012    FAMILY HISTORY Family History  Problem Relation Age of Onset  . Cancer Mother     endometrial  . Cancer Maternal Grandmother     breast  . Hypertension Paternal Grandfather    the patient's father died from complications of Parkinson's disease at the age of 19. The patient's mother died from recurrent endometrial cancer at the age of 48. The patient had one brother and one sister. There is no history of ovarian or breast cancer in the family other than a maternal grandmother who may or may not have had breast  cancer.  GYNECOLOGIC HISTORY:  Menarche age 27, first live birth age 73, the patient is GX P2, stopped having periods approximately 2003, which is also when she stopped having hormone replacement. She also took birth control pills for a period of 9 years off and on, before 1973.  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 She is divorced and shares a home with a female friend. Her daughter Andres Ege lives in [place] and works as a Teacher, early years/pre. Her son Courtany Mcmurphy lives in [place] and owns a tree business. The patient has 2 grandchildren. She attends the [employer]    ADVANCED DIRECTIVES: In place; the patient tells me she has named her daughter Shawna Orleans as healthcare part turning. Shawna Orleans can be contacted at [phone] or [phone].   HEALTH MAINTENANCE: History  Substance Use Topics  . Smoking status: Former Smoker -- 15 years    Types: Cigarettes    Quit date: 10/10/1988  . Smokeless tobacco: Never Used  . Alcohol Use: 0.0 oz/week     Comment: 1 glass wine daily     Colonoscopy: January of 2013  PAP: February of 2013  Bone density: December of 2012  Lipid panel:  Allergies  Allergen Reactions  . Sudafed (Pseudoephedrine Hcl)     Makes pt jittery    Current Outpatient Prescriptions  Medication Sig Dispense Refill  . AMOXICILLIN PO Take by mouth.      Marland Kitchen anastrozole (ARIMIDEX) 1 MG tablet Take 1 tablet (1 mg total) by mouth daily.  90 tablet  4  . cholecalciferol (VITAMIN D) 1000 UNITS tablet Take 1,000 Units by mouth daily.      Marland Kitchen MICARDIS HCT 40-12.5 MG per tablet 1 tablet daily. AM      . Multiple Vitamins-Minerals (MULTIVITAMIN PO) Take 1 tablet by mouth daily.      . sertraline (ZOLOFT) 25 MG tablet Take 25 mg by mouth daily. PM      . solifenacin (VESICARE) 5 MG tablet Take 5 mg by mouth daily. AM      . zolpidem (AMBIEN) 5 MG tablet Take 5 mg by mouth as needed.         No current facility-administered medications for this visit.    OBJECTIVE: Middle-aged white woman in no acute distress Filed Vitals:   04/01/13 1336  BP: 133/81  Pulse: 64  Temp: 97.9 F (36.6 C)  Resp: 20     Body mass index is 31.41 kg/(m^2).    ECOG FS: 0  Sclerae unicteric Oropharynx clear No cervical or supraclavicular adenopathy Lungs no rales or rhonchi Heart regular rate and rhythm Abd benign MSK no focal spinal tenderness, no peripheral edema Neuro: non-focal, well-oriented, appropriate affect Breasts: The right breast is status post lumpectomy and MammoSite therapy. There is minimal dimpling, M.D. Dicky Doe.  cosmetic effect is good. The right axilla is benign. The left breast is unremarkable.   LAB RESULTS:  CMP     Component Value Date/Time   NA 140 03/27/2012 1450   K 3.6 03/27/2012 1450   CL 101 03/27/2012 1450   CO2 28 03/27/2012 1450   GLUCOSE 85 03/27/2012 1450   BUN 26* 03/27/2012 1450   CREATININE 1.05 03/27/2012 1450   CALCIUM 9.8 03/27/2012 1450   PROT 7.3 03/27/2012 1450   ALBUMIN 4.0 03/27/2012 1450   AST 16 03/27/2012 1450   ALT 14 03/27/2012 1450   ALKPHOS 61 03/27/2012 1450   BILITOT 0.4 03/27/2012 1450   GFRNONAA 64* 01/24/2012 1625   GFRAA 74* 01/24/2012 1625    I No results found for this basename: SPEP,  UPEP,   kappa and lambda light chains    Lab Results  Component Value Date   WBC 5.5 09/27/2012   NEUTROABS 2.8 09/27/2012   HGB 13.5 09/27/2012   HCT 38.0 09/27/2012   MCV 88.1 09/27/2012   PLT 240 09/27/2012      Chemistry      Component Value Date/Time   NA 140 03/27/2012 1450   K 3.6 03/27/2012 1450   CL 101 03/27/2012 1450   CO2 28 03/27/2012 1450   BUN 26* 03/27/2012 1450   CREATININE 1.05 03/27/2012 1450      Component Value Date/Time   CALCIUM 9.8 03/27/2012 1450   ALKPHOS 61 03/27/2012 1450   AST 16 03/27/2012 1450   ALT 14 03/27/2012 1450   BILITOT 0.4 03/27/2012 1450       Lab Results  Component Value Date   LABCA2 14  01/11/2012    No components found with this basename: LABCA125    No results found for this basename: INR,  in the last 168 hours  Urinalysis No results found for this basename: colorurine,  appearanceur,  labspec,  phurine,  glucoseu,  hgbur,  bilirubinur,  ketonesur,  proteinur,  urobilinogen,  nitrite,  leukocytesur    STUDIES: No results found.  ASSESSMENT: 72 y.o. [place] woman status is right lumpectomy and sentinel lymph node sampling 01/30/2012 for a pT1c pN0, stage IA invasive ductal carcinoma, grade 1, estrogen receptor 100% and progesterone receptor 97% positive, with no HER-2 amplification and an MIB-1 of 12%.  (1) brachytherapy completed 02/10/2012  (2) letrozole started May 2013; DEXA scan 09/21/2011 showed mild osteopenia with a T score of -1.3  (3) status post remote total abdominal hysterectomy with bilateral salpingo--oophorectomy  PLAN: We spent the better part of today's hour-long visit going over your of onset history and completing her database. She understands she has a very good prognosis overall, and will have a less than 10% risk of recurrence when she completes her 5 years of an anastrozole. She had many other questions, some of which were not directly related to her situation (1 of her co-parishioners has designed a "chemotherapy shirt" and is trying to market it. Another co-parishioner is marketing an alternative medicine product she requested I research; she understands I would not be able to do that for her, unfortunately).   She is going to see my physician's assistant in September, then have a repeat bone density in December, when she sees her Careers adviser. She will see me again next March. At that point we may start once a year followup here alternating with her surgeon. Elon noted call for any problems that may develop before the next visit   Lowella Dell, MD  04/01/2013 6:50 PM

## 2013-04-01 NOTE — Telephone Encounter (Signed)
, °

## 2013-04-02 NOTE — Addendum Note (Signed)
Addended by: Billey Co on: 04/02/2013 05:18 PM   Modules accepted: Orders, Medications

## 2013-05-10 ENCOUNTER — Telehealth: Payer: Self-pay | Admitting: *Deleted

## 2013-05-10 NOTE — Telephone Encounter (Signed)
Pt called to cancel her appts for Sept and to rs due to she will be out of town. Pt requested to come in the first week of Oct. gv appt for labs 10.6.14 @ 1:30pm and ov 07/22/13 @ 11:15pm. Pt is aware...td

## 2013-06-24 ENCOUNTER — Other Ambulatory Visit: Payer: Medicare Other

## 2013-07-01 ENCOUNTER — Ambulatory Visit: Payer: Medicare Other | Admitting: Physician Assistant

## 2013-07-15 ENCOUNTER — Other Ambulatory Visit (HOSPITAL_BASED_OUTPATIENT_CLINIC_OR_DEPARTMENT_OTHER): Payer: Medicare Other

## 2013-07-15 DIAGNOSIS — C50411 Malignant neoplasm of upper-outer quadrant of right female breast: Secondary | ICD-10-CM

## 2013-07-15 DIAGNOSIS — C50911 Malignant neoplasm of unspecified site of right female breast: Secondary | ICD-10-CM

## 2013-07-15 DIAGNOSIS — C50419 Malignant neoplasm of upper-outer quadrant of unspecified female breast: Secondary | ICD-10-CM

## 2013-07-15 LAB — CBC WITH DIFFERENTIAL/PLATELET
BASO%: 1.3 % (ref 0.0–2.0)
HCT: 38.6 % (ref 34.8–46.6)
MCHC: 34.5 g/dL (ref 31.5–36.0)
MONO#: 0.6 10*3/uL (ref 0.1–0.9)
NEUT%: 58.9 % (ref 38.4–76.8)
RBC: 4.44 10*6/uL (ref 3.70–5.45)
RDW: 13 % (ref 11.2–14.5)
WBC: 5.9 10*3/uL (ref 3.9–10.3)
lymph#: 1.5 10*3/uL (ref 0.9–3.3)

## 2013-07-15 LAB — COMPREHENSIVE METABOLIC PANEL (CC13)
ALT: 23 U/L (ref 0–55)
AST: 20 U/L (ref 5–34)
CO2: 27 mEq/L (ref 22–29)
Calcium: 9.9 mg/dL (ref 8.4–10.4)
Chloride: 106 mEq/L (ref 98–109)
Potassium: 4.1 mEq/L (ref 3.5–5.1)
Sodium: 141 mEq/L (ref 136–145)
Total Protein: 7.3 g/dL (ref 6.4–8.3)

## 2013-07-22 ENCOUNTER — Encounter: Payer: Self-pay | Admitting: Physician Assistant

## 2013-07-22 ENCOUNTER — Encounter (INDEPENDENT_AMBULATORY_CARE_PROVIDER_SITE_OTHER): Payer: Self-pay

## 2013-07-22 ENCOUNTER — Telehealth: Payer: Self-pay | Admitting: *Deleted

## 2013-07-22 ENCOUNTER — Ambulatory Visit (HOSPITAL_BASED_OUTPATIENT_CLINIC_OR_DEPARTMENT_OTHER): Payer: Medicare Other | Admitting: Physician Assistant

## 2013-07-22 VITALS — BP 117/80 | HR 66 | Temp 98.2°F | Resp 20 | Ht 62.5 in | Wt 173.3 lb

## 2013-07-22 DIAGNOSIS — G4700 Insomnia, unspecified: Secondary | ICD-10-CM | POA: Insufficient documentation

## 2013-07-22 DIAGNOSIS — C50411 Malignant neoplasm of upper-outer quadrant of right female breast: Secondary | ICD-10-CM

## 2013-07-22 DIAGNOSIS — C50419 Malignant neoplasm of upper-outer quadrant of unspecified female breast: Secondary | ICD-10-CM

## 2013-07-22 DIAGNOSIS — Z853 Personal history of malignant neoplasm of breast: Secondary | ICD-10-CM

## 2013-07-22 DIAGNOSIS — M8580 Other specified disorders of bone density and structure, unspecified site: Secondary | ICD-10-CM | POA: Insufficient documentation

## 2013-07-22 NOTE — Telephone Encounter (Signed)
appts made and printed. Pt changes her arrival times for 12/2013 appts. Pt is aware that LM will call w/ her genetic appts. Pt made me aware that she call and schedule her own mammo appts...td

## 2013-07-22 NOTE — Progress Notes (Signed)
ID: Joy Owens OB: Nov 21, 1940  MR#: [id number]  WUJ#:[id number]  PCP: Astrid Divine, MD GYN: Marcelle Overlie SU: Cicero Duck  OTHER MD: Wendall Papa, Lunette Stands,  Christina  Haverstock   HISTORY OF PRESENT ILLNESS:  From Dr. Theron Arista Rubin's intake note 01/30/2012:  "This patient is the previous good health. She has undergone annual screening mammography. Screening mammogram from 12/21/2011 showed a possible mass in the right breast. She presented on a mammogram in November of 2012 because of a small nodule seen in the right breast. This is felt to be exacerbation cysts. On the current mammogram no other abnormalities were seen. The patient was referred for biopsy. 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 Only a bright T2 lesion midthoracic spine is felt to be a hemangioma."    Her subsequent history ios as detailed below  INTERVAL HISTORY: Joy Owens returns today for routine follow up of her right breast cancer. Interval history is unremarkable, and she is feeling well today. She continues on anastrozole with good tolerance. She has only occasional mild hot flashes. She has some known arthritis in her hips and hands, but does not feel like this has increased significantly since beginning the anastrozole. She denies any problems with vaginal dryness, and of course denies vaginal bleeding.   She does have a "funny feeling" on the top of her left foot which she describes as a tingling. She denies, however, any actual numbness, and apparently this comes and goes.  She is followed regularly by Dr. Valentina Lucks for what she calls "borderline diabetes".   Her only other complaint today is some continuing tenderness in the  surgical site of the right breast.    REVIEW OF SYSTEMS: Joy Owens denies any recent illnesses and has had no fevers, chills, night sweats, or unexplained weight loss. Her energy level is fairly good. She exercises approximately twice weekly. She does have difficulty sleeping for which she takes Ambien occasionally melatonin. She's eating and drinking well with no nausea or change in bowel or bladder habits. She's had no cough, increased shortness of breath, or chest pain. She denies abnormal headaches, dizziness, or change in vision. She also denies any new or unusual myalgias, arthralgias, bony pain, or peripheral swelling.  A detailed review of systems is otherwise stable and noncontributory.  PAST MEDICAL HISTORY: Past Medical History  Diagnosis Date  . Osteopenia   . Anxiety     situational  . Cataract immature   . Hypertension     under control, has been on med. x 10 yrs.  . Diabetes mellitus     diet-controlled  . Seasonal allergies   . Overactive bladder   . Arthritis     hand  . Abrasion of finger 01/24/2012  . Cancer 01/2012    right breast  . Complication of anesthesia 1973    prolonged emergence and no memory of events x 5 days    PAST SURGICAL HISTORY: Past Surgical History  Procedure Laterality Date  . Colonoscopy  2003, 2008, 10/2011  . Polypectomy  2003     with colonoscopy  . Tonsillectomy  1953  . Tubal ligation  1973  . Laparoscopic assisted vaginal hysterectomy  12/01/2009    with BSO, posterior repair  . Dilation  and curettage of uterus  01/31/2001    with hysteroscopy  . Cataract extraction      x 1  . Breast mammosite  01/30/2012    Procedure: MAMMOSITE BREAST;  Surgeon: Currie Paris, MD;  [facility];  Service: General;  Laterality: Right;   mammosite placement  . Breast surgery    . Breast lumpectomy  01/30/2012    FAMILY HISTORY Family History  Problem Relation Age of Onset  . Cancer Mother     endometrial  . Cancer  Maternal Grandmother     breast  . Hypertension Paternal Grandfather    the patient's father died from complications of Parkinson's disease at the age of 103. The patient's mother died from recurrent endometrial cancer at the age of 43. The patient had one brother and one sister. There is no history of ovarian or breast cancer in the family other than a maternal grandmother who may or may not have had breast cancer.  GYNECOLOGIC HISTORY:  Menarche age 13, first live birth age 62, the patient is GX P2, stopped having periods approximately 2003, which is also when she stopped having hormone replacement. She also took birth control pills for a period of 9 years off and on, before 1973.  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 She is divorced and shares a home with a female friend. Her daughter Andres Ege lives in [place] and works as a Teacher, early years/pre. Her son Keshana Klemz lives in [place] and owns a tree business. The patient has 2 grandchildren. She attends the [employer]    ADVANCED DIRECTIVES: In place; the patient tells me she has named her daughter Shawna Orleans as healthcare part turning. Shawna Orleans can be contacted at [phone] or [phone].   HEALTH MAINTENANCE: (Updated 07/22/2013) History  Substance Use Topics  . Smoking status: Former Smoker -- 15 years    Types: Cigarettes    Quit date: 10/10/1988  . Smokeless tobacco: Never Used  . Alcohol Use: 0.0 oz/week     Comment: 1 glass wine daily     Colonoscopy: January of 2013  PAP: February of 2013  Bone density: December of 2012, osteopenia  Lipid panel: Dr. Valentina Lucks, UTD   Allergies  Allergen Reactions  . Sudafed [Pseudoephedrine Hcl]     Makes pt jittery    Current Outpatient Prescriptions  Medication Sig Dispense Refill  . anastrozole (ARIMIDEX) 1 MG tablet Take 1 tablet (1 mg total) by mouth daily.  90 tablet  4  . aspirin 81 MG  tablet Take 81 mg by mouth daily.      . calcium carbonate (OS-CAL) 600 MG TABS Take 600 mg by mouth 2 (two) times daily with a meal. With D      . cholecalciferol (VITAMIN D) 1000 UNITS tablet Take 1,000 Units by mouth daily.      Marland Kitchen CINNAMON PO Take 1 tablet by mouth daily.      . Melatonin 2.5 MG CAPS Take 2.5 mg by mouth at bedtime as needed (insomnia).      Marland Kitchen MICARDIS HCT 40-12.5 MG per tablet 1 tablet daily. AM      . Multiple Vitamins-Minerals (MULTIVITAMIN PO) Take 1 tablet by mouth daily.      . Phosphatidylserine-DHA-EPA (VAYACOG PO) Take 1 tablet by mouth daily.      . sertraline (ZOLOFT) 25 MG tablet Take 25 mg by mouth daily.  PM      . solifenacin (VESICARE) 5 MG tablet Take 5 mg by mouth daily. AM      . zolpidem (AMBIEN) 5 MG tablet Take 5 mg by mouth as needed.        No current facility-administered medications for this visit.    OBJECTIVE: Middle-aged white woman in no acute distress Filed Vitals:   07/22/13 1112  BP: 117/80  Pulse: 66  Temp: 98.2 F (36.8 C)  Resp: 20     Body mass index is 31.17 kg/(m^2).    ECOG FS: 0 Filed Weights   07/22/13 1112  Weight: 173 lb 4.8 oz (78.608 kg)   Physical Exam: HEENT:  Sclerae anicteric.  Oropharynx clear.  NODES:  No cervical or supraclavicular lymphadenopathy palpated.  BREAST EXAM: Right breast is status post lumpectomy. Mild tenderness to palpation, but no suspicious nodularities and no evidence of local recurrence. Left breast is unremarkable. Axillae are benign bilaterally with no palpable adenopathy. LUNGS:  Clear to auscultation bilaterally.  No wheezes or rhonchi HEART:  Regular rate and rhythm. No murmur  ABDOMEN:  Soft, nontender.  Positive bowel sounds.  MSK:  No focal spinal tenderness to palpation. Full range of motion in the upper extremities. EXTREMITIES:  No peripheral edema.   NEURO:  Nonfocal. Well oriented.  Positive affect.    LAB RESULTS:   Lab Results  Component Value Date   WBC 5.9  07/15/2013   NEUTROABS 3.5 07/15/2013   HGB 13.3 07/15/2013   HCT 38.6 07/15/2013   MCV 86.9 07/15/2013   PLT 255 07/15/2013      Chemistry      Component Value Date/Time   NA 141 07/15/2013 1341   NA 140 03/27/2012 1450   K 4.1 07/15/2013 1341   K 3.6 03/27/2012 1450   CL 101 03/27/2012 1450   CO2 27 07/15/2013 1341   CO2 28 03/27/2012 1450   BUN 18.6 07/15/2013 1341   BUN 26* 03/27/2012 1450   CREATININE 0.9 07/15/2013 1341   CREATININE 1.05 03/27/2012 1450      Component Value Date/Time   CALCIUM 9.9 07/15/2013 1341   CALCIUM 9.8 03/27/2012 1450   ALKPHOS 52 07/15/2013 1341   ALKPHOS 61 03/27/2012 1450   AST 20 07/15/2013 1341   AST 16 03/27/2012 1450   ALT 23 07/15/2013 1341   ALT 14 03/27/2012 1450   BILITOT 0.38 07/15/2013 1341   BILITOT 0.4 03/27/2012 1450       STUDIES:  Most recent bone density at [facility] on 09/21/2011 showed osteopenia.  Most recent bilateral mammogram on 01/14/2013 was unremarkable. Annual mammograms were recommended.    ASSESSMENT: 72 y.o. [place] woman   (1)  status is right lumpectomy and sentinel lymph node sampling 01/30/2012 for a pT1c pN0, stage IA invasive ductal carcinoma, grade 1, estrogen receptor 100% and progesterone receptor 97% positive, with no HER-2 amplification and an MIB-1 of 12%.  (2) brachytherapy completed 02/10/2012  (3) letrozole started May 2013  (4)  DEXA scan 09/21/2011 showed mild osteopenia with a T score of -1.3  (5) status post remote total abdominal hysterectomy with bilateral salpingo--oophorectomy    PLAN:  Overall, I believe Joy Owens is doing well with regards to her breast cancer, and there is no clinical evidence of disease recurrence at this time. She's tolerating the anastrozole well, and will continue at 1 mg daily. Of course the goal is to continue for total of 5 years (until May 2018). I've encouraged  her to continue taking her calcium and to increase her exercise which I think will help  with both her bone density and her insomnia. She is already scheduled to have her bone density repeated this December with Dr. Vincente Poli.  Tekesha is already scheduled to see Dr. Darnelle Catalan here in March. If she is still doing as well, we will likely go to annual followups at that time. Her next mammogram will be due in April, and that'll be scheduled for her as well. (Perhaps future follow ups could be scheduled in mid April, so that these occurr just after her mammograms.)   Atha had some questions today about genetic testing, and, per her request, I am also placing a referral for a genetic counseling with [facility].  She voices understanding and agreement with the above plan. She knows as always to call with any changes or problems.   Deral Schellenberg, PA-C   07/22/2013 12:54 PM

## 2013-07-25 ENCOUNTER — Telehealth: Payer: Self-pay | Admitting: *Deleted

## 2013-07-25 NOTE — Telephone Encounter (Signed)
Left message for pt to return my call so I can schedule a genetic appt.  

## 2013-07-29 ENCOUNTER — Telehealth: Payer: Self-pay | Admitting: *Deleted

## 2013-07-29 NOTE — Telephone Encounter (Signed)
Pt returned my call and I confirmed 10/31/13 genetic appt w/ pt.  Pt is not happy that she has to wait this long.  I apologized on behalf of [facility].

## 2013-08-29 ENCOUNTER — Telehealth (INDEPENDENT_AMBULATORY_CARE_PROVIDER_SITE_OTHER): Payer: Self-pay | Admitting: General Surgery

## 2013-08-29 NOTE — Telephone Encounter (Signed)
Spoke with patient and moved appt time on 09/18/2013.

## 2013-09-18 ENCOUNTER — Ambulatory Visit (INDEPENDENT_AMBULATORY_CARE_PROVIDER_SITE_OTHER): Payer: Medicare Other | Admitting: Surgery

## 2013-09-18 ENCOUNTER — Encounter (INDEPENDENT_AMBULATORY_CARE_PROVIDER_SITE_OTHER): Payer: Self-pay | Admitting: Surgery

## 2013-09-18 VITALS — BP 130/78 | HR 71 | Resp 16 | Ht 62.0 in | Wt 178.0 lb

## 2013-09-18 DIAGNOSIS — Z853 Personal history of malignant neoplasm of breast: Secondary | ICD-10-CM

## 2013-09-18 NOTE — Progress Notes (Signed)
NAME: Joy Owens       DOB: 1941-03-06           DATE: 09/18/2013       MRN: [id number]   [name] is a 72 y.o.Marland Kitchenfemale who presents for routine followup of her Stage I, right IDC, receptor+, her2 Neg, diagnosed in March 2013 and treated with lumpectomy, SLN, Mammosite radiation, and anti estrogen therapy.She had an infection at the lumpectomy site that took a long time to resolve.   She has no problems or concerns on either side, other than some persistent lymphedema in the right axillary area  PFSH: She has had no significant changes since the last visit here.  ROS: There have been no significant changes since the last visit here  EXAM:  VS: BP 130/78  Pulse 71  Resp 16  Ht 5\' 2"  (1.575 m)  Wt 178 lb (80.74 kg)  BMI 32.55 kg/m2  General: The patient is alert, oriented, generally healthy appearing, NAD. Mood and affect are normal.  Breasts:  right breast is a little smaller than the left. There is some dimpling at the lumpectomy site from the radiation and her prior infection afterwards. There is some firmness just superior to the incision, but seems softer than beforeThere is nothing to suggest a recurrence. The skin nipple areas looked normal. The left breast looks normal.    Lymphatics: She has no axillary or supraclavicular adenopathy on either side.  Extremities: Full ROM of the surgical side with no lymphedema noted.       Data Reviewed: Mammogram [phone]: Findings:  ACR Breast Density Category 2: There is a scattered fibroglandular  pattern.  Lumpectomy changes are seen in the left breast. There is no  suspicious mass or malignant-type microcalcifications in either  breast.  Mammographic images were processed with CAD.  IMPRESSION:  No evidence of malignancy in either breast.  RECOMMENDATION:  Diagnostic mammogram in 1 year is recommended.  I have discussed the findings and recommendations with the patient.  Results were also provided in  writing at the conclusion of the  visit. If applicable, a reminder letter will be sent to the  patient regarding her next appointment.  BI-RADS CATEGORY 2: Benign finding(s).  Original Report Authenticated By: Baird Lyons, M.D.   Clinical Data: History of right breast cancer status post  lumpectomy and radiation therapy in April 2013. The patient  complains of right breast tenderness. There is no signs of  erythema or infection.  DIGITAL DIAGNOSTIC RIGHT MAMMOGRAM WITH CAD  Comparison: With priors  Findings:  Breast Density: ACR Category 2: There is a scattered  fibroglandular pattern.  There is no suspicious mass or malignant-type microcalcifications.  Lumpectomy changes are present.  Mammographic images were processed with CAD.  IMPRESSION:  No evidence of malignancy in the right breast.  RECOMMENDATION:  Bilateral diagnostic mammogram in March 2014 is recommended.  I have discussed the findings and recommendations with the patient.  Results were also provided in writing at the conclusion of the  visit.  BI-RADS CATEGORY 2: Benign finding(s).  Original Report Authenticated By: Baird Lyons, M.D.    Impression: Doing well, with no evidence of recurrent cancer or new cancer  Plan: RTC one year

## 2013-09-18 NOTE — Patient Instructions (Signed)
Continued annual mammograms and annual followups in this office

## 2013-09-24 ENCOUNTER — Other Ambulatory Visit: Payer: Self-pay | Admitting: Obstetrics and Gynecology

## 2013-10-30 ENCOUNTER — Telehealth: Payer: Self-pay | Admitting: Oncology

## 2013-10-31 ENCOUNTER — Encounter: Payer: Medicare Other | Admitting: Genetic Counselor

## 2013-10-31 ENCOUNTER — Other Ambulatory Visit: Payer: Medicare Other

## 2013-12-06 ENCOUNTER — Telehealth: Payer: Self-pay | Admitting: *Deleted

## 2013-12-06 NOTE — Telephone Encounter (Signed)
Returned pt called to confirm her appts for 12/2013...td

## 2013-12-10 ENCOUNTER — Ambulatory Visit (HOSPITAL_BASED_OUTPATIENT_CLINIC_OR_DEPARTMENT_OTHER): Payer: Medicare Other

## 2013-12-10 ENCOUNTER — Other Ambulatory Visit: Payer: Self-pay | Admitting: Physician Assistant

## 2013-12-10 DIAGNOSIS — C50411 Malignant neoplasm of upper-outer quadrant of right female breast: Secondary | ICD-10-CM

## 2013-12-10 DIAGNOSIS — C50419 Malignant neoplasm of upper-outer quadrant of unspecified female breast: Secondary | ICD-10-CM

## 2013-12-10 LAB — COMPREHENSIVE METABOLIC PANEL (CC13)
ALK PHOS: 60 U/L (ref 40–150)
ALT: 21 U/L (ref 0–55)
AST: 18 U/L (ref 5–34)
Albumin: 4 g/dL (ref 3.5–5.0)
Anion Gap: 11 mEq/L (ref 3–11)
BILIRUBIN TOTAL: 0.52 mg/dL (ref 0.20–1.20)
BUN: 22.5 mg/dL (ref 7.0–26.0)
CO2: 26 mEq/L (ref 22–29)
Calcium: 10.2 mg/dL (ref 8.4–10.4)
Chloride: 106 mEq/L (ref 98–109)
Creatinine: 0.9 mg/dL (ref 0.6–1.1)
Glucose: 103 mg/dl (ref 70–140)
POTASSIUM: 4 meq/L (ref 3.5–5.1)
Sodium: 144 mEq/L (ref 136–145)
TOTAL PROTEIN: 7.3 g/dL (ref 6.4–8.3)

## 2013-12-10 LAB — CBC WITH DIFFERENTIAL/PLATELET
BASO%: 0.7 % (ref 0.0–2.0)
Basophils Absolute: 0 10*3/uL (ref 0.0–0.1)
EOS%: 4.2 % (ref 0.0–7.0)
Eosinophils Absolute: 0.2 10*3/uL (ref 0.0–0.5)
HCT: 40 % (ref 34.8–46.6)
HGB: 13.5 g/dL (ref 11.6–15.9)
LYMPH%: 25.5 % (ref 14.0–49.7)
MCH: 29.6 pg (ref 25.1–34.0)
MCHC: 33.8 g/dL (ref 31.5–36.0)
MCV: 87.6 fL (ref 79.5–101.0)
MONO#: 0.6 10*3/uL (ref 0.1–0.9)
MONO%: 10.7 % (ref 0.0–14.0)
NEUT#: 3.3 10*3/uL (ref 1.5–6.5)
NEUT%: 58.9 % (ref 38.4–76.8)
PLATELETS: 243 10*3/uL (ref 145–400)
RBC: 4.56 10*6/uL (ref 3.70–5.45)
RDW: 12.7 % (ref 11.2–14.5)
WBC: 5.6 10*3/uL (ref 3.9–10.3)
lymph#: 1.4 10*3/uL (ref 0.9–3.3)

## 2013-12-23 ENCOUNTER — Other Ambulatory Visit: Payer: Medicare Other

## 2013-12-30 ENCOUNTER — Ambulatory Visit (HOSPITAL_BASED_OUTPATIENT_CLINIC_OR_DEPARTMENT_OTHER): Payer: Medicare Other | Admitting: Oncology

## 2013-12-30 ENCOUNTER — Ambulatory Visit: Payer: Medicare Other | Admitting: Oncology

## 2013-12-30 VITALS — BP 131/80 | HR 70 | Temp 98.0°F | Resp 18 | Ht 62.0 in | Wt 176.6 lb

## 2013-12-30 DIAGNOSIS — H269 Unspecified cataract: Secondary | ICD-10-CM

## 2013-12-30 DIAGNOSIS — F411 Generalized anxiety disorder: Secondary | ICD-10-CM

## 2013-12-30 DIAGNOSIS — C50911 Malignant neoplasm of unspecified site of right female breast: Secondary | ICD-10-CM

## 2013-12-30 DIAGNOSIS — Z170 Estrogen receptor positive status [ER+]: Secondary | ICD-10-CM

## 2013-12-30 DIAGNOSIS — M7989 Other specified soft tissue disorders: Secondary | ICD-10-CM

## 2013-12-30 DIAGNOSIS — C50419 Malignant neoplasm of upper-outer quadrant of unspecified female breast: Secondary | ICD-10-CM

## 2013-12-30 NOTE — Progress Notes (Signed)
ID: Joy Owens OB: 1941-10-10  MR#: [id number]  FWY#:[id number]  PCP: [name], MD GYN: [name] SU: [name]  OTHER MD: [name], [name],  Christina  Haverstock   HISTORY OF PRESENT ILLNESS:  From Dr. Collier [name]'s intake note 01/30/2012:  "This patient is the previous good health. She has undergone annual screening mammography. Screening mammogram from 12/21/2011 showed a possible mass in the right breast. She presented on a mammogram in November of 2012 because of a small nodule seen in the right breast. This is felt to be exacerbation cysts. On the current mammogram no other abnormalities were seen. The patient was referred for biopsy. 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 Only a bright T2 lesion midthoracic spine is felt to be a hemangioma."    Her subsequent history ios as detailed below  INTERVAL HISTORY: Joy Owens returns today for  followup of her breast cancer. The interval history is significant for her havng developed significant continuing sinus drainage, which is being treated by Dr. Laurann Montana with amoxicillin. The amoxicillin in turn upsets the patient's stomach.  REVIEW OF SYSTEMS: Joy Owens feels very frustrated with the drainage issue,. In addition she sleeps poorly. She has stress urinary incontinence for which he takes Vesicare, with mild success. She continues to have some right upper extremity swelling, very intermittently, and aches and pains notable in the surgical bed but in many other areas as well, secondary to arthritis. She feels anxious and findings of Zoloft does help. She tells me she has been diagnosed with prediabetes. Otherwise a detailed review of  systems today was stable  PAST MEDICAL HISTORY: Past Medical History  Diagnosis Date  . Osteopenia   . Anxiety     situational  . Cataract immature   . Hypertension     under control, has been on med. x 10 yrs.  . Diabetes mellitus     diet-controlled  . Seasonal allergies   . Overactive bladder   . Arthritis     hand  . Abrasion of finger 01/24/2012  . Cancer 01/2012    right breast  . Complication of anesthesia 1973    prolonged emergence and no memory of events x 5 days    PAST SURGICAL HISTORY: Past Surgical History  Procedure Laterality Date  . Colonoscopy  2003, 2008, 10/2011  . Polypectomy  2003     with colonoscopy  . Tonsillectomy  1953  . Tubal ligation  1973  . Laparoscopic assisted vaginal hysterectomy  12/01/2009    with BSO, posterior repair  . Dilation and curettage of uterus  01/31/2001    with hysteroscopy  . Cataract extraction      x 1  . Breast mammosite  01/30/2012    Procedure: MAMMOSITE BREAST;  Surgeon: Haywood Lasso, MD;  Location: [facility];  Service: General;  Laterality: Right;   mammosite placement  . Breast surgery    . Breast lumpectomy  01/30/2012    FAMILY HISTORY Family History  Problem Relation Age of Onset  . Cancer Mother     endometrial  . Cancer Maternal Grandmother     breast  . Hypertension Paternal Grandfather    the patient's father died from complications of Parkinson's disease  at the age of 20. The patient's mother died from recurrent endometrial cancer at the age of 32. The patient had one brother and one sister. There is no history of ovarian or breast cancer in the family other than a maternal grandmother who may or may not have had breast cancer.  GYNECOLOGIC HISTORY:  Menarche age 6, first live birth age 53, the patient is [place] P2, stopped having periods approximately 2003, which is also when she stopped having hormone replacement. She also took birth control pills for a period of 9 years off and on,  before 1973.  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 She is divorced and shares a home with a female friend. Her daughter Joy Owens lives in [place] and works as a Software engineer. Her son Joy Owens lives in [place] and owns a tree business. The patient has 2 grandchildren. She attends the [employer]. -- Her sister in law , [name], is also our patient.    ADVANCED DIRECTIVES: In place; the patient tells me she has named her daughter [name] as healthcare part turning. [name] can be contacted at [phone] or [phone].   HEALTH MAINTENANCE: (Updated 07/22/2013) History  Substance Use Topics  . Smoking status: Former Smoker -- 15 years    Types: Cigarettes    Quit date: 10/10/1988  . Smokeless tobacco: Never Used  . Alcohol Use: 0.0 oz/week     Comment: 1 glass wine daily     Colonoscopy: January of 2013  PAP: February of 2013  Bone density: December of 2012, osteopenia  Lipid panel: Dr. Laurann Montana, UTD   Allergies  Allergen Reactions  . Sudafed [Pseudoephedrine Hcl]     Makes pt jittery    Current Outpatient Prescriptions  Medication Sig Dispense Refill  . anastrozole (ARIMIDEX) 1 MG tablet Take 1 tablet (1 mg total) by mouth daily.  90 tablet  4  . aspirin 81 MG tablet Take 81 mg by mouth daily.      . calcium carbonate (OS-CAL) 600 MG TABS Take 600 mg by mouth 2 (two) times daily with a meal. With D      . cholecalciferol (VITAMIN D) 1000 UNITS tablet Take 1,000 Units by mouth daily.      Marland Kitchen CINNAMON PO Take 1 tablet by mouth daily.      . Melatonin 2.5 MG CAPS Take 2.5 mg by mouth at bedtime as needed (insomnia).      . MELOXICAM PO Take by mouth.      Marland Kitchen MICARDIS HCT 40-12.5 MG per tablet 1 tablet daily. AM      . Multiple Vitamins-Minerals (MULTIVITAMIN PO) Take 1 tablet by mouth daily.      . Phosphatidylserine-DHA-EPA (VAYACOG PO) Take 1 tablet by mouth daily.       . sertraline (ZOLOFT) 25 MG tablet Take 25 mg by mouth daily. PM      . solifenacin (VESICARE) 5 MG tablet Take 5 mg by mouth daily. AM      . zolpidem (AMBIEN) 5 MG tablet Take 5 mg by mouth as needed.        No current facility-administered medications for this visit.    OBJECTIVE: Middle-aged white woman who appears stated age 72 Vitals:   12/30/13 1130  BP: 131/80  Pulse: 70  Temp: 98 F (36.7 C)  Resp: 18     Body mass index is 32.29 kg/(m^2).  ECOG FS: 1 Filed Weights   12/30/13 1130  Weight: 176 lb 9.6 oz (80.105 kg)   Sclerae unicteric, pupils equal and reactive Oropharynx clear and moist No cervical or supraclavicular adenopathy Lungs no rales or rhonchi Heart regular rate and rhythm Abd soft, obese, nontender, positive bowel sounds MSK no focal spinal tenderness, I do not detect right upper extremity lymphedema Neuro: nonfocal, well oriented, frustrated affect Breasts: Right breast is status post lumpectomy and radiation. There is no evidence of local recurrence. The right axilla is benign. The left breast is unremarkable.  LAB RESULTS:   Lab Results  Component Value Date   WBC 5.6 12/10/2013   NEUTROABS 3.3 12/10/2013   HGB 13.5 12/10/2013   HCT 40.0 12/10/2013   MCV 87.6 12/10/2013   PLT 243 12/10/2013      Chemistry      Component Value Date/Time   NA 144 12/10/2013 1119   NA 140 03/27/2012 1450   K 4.0 12/10/2013 1119   K 3.6 03/27/2012 1450   CL 101 03/27/2012 1450   CO2 26 12/10/2013 1119   CO2 28 03/27/2012 1450   BUN 22.5 12/10/2013 1119   BUN 26* 03/27/2012 1450   CREATININE 0.9 12/10/2013 1119   CREATININE 1.05 03/27/2012 1450      Component Value Date/Time   CALCIUM 10.2 12/10/2013 1119   CALCIUM 9.8 03/27/2012 1450   ALKPHOS 60 12/10/2013 1119   ALKPHOS 61 03/27/2012 1450   AST 18 12/10/2013 1119   AST 16 03/27/2012 1450   ALT 21 12/10/2013 1119   ALT 14 03/27/2012 1450   BILITOT 0.52 12/10/2013 1119   BILITOT 0.4 03/27/2012 1450       STUDIES:  Most  recent bone density at [facility] on 09/21/2011 showed osteopenia.  Most recent bilateral mammogram on 01/14/2013 was unremarkable. Repeat next month as her ready scheduled  ASSESSMENT: 73 y.o. [place] woman   (1)  status is right lumpectomy and sentinel lymph node sampling 01/30/2012 for a pT1c pN0, stage IA invasive ductal carcinoma, grade 1, estrogen receptor 100% and progesterone receptor 97% positive, with no HER-2 amplification and an MIB-1 of 12%.  (2) brachytherapy completed 02/10/2012  (3) letrozole started May 2013  (4) DEXA scan 12th 16 2014 showed stable osteopenia with a T score of -1.3 at the left femoral neck   (5) status post remote total abdominal hysterectomy with bilateral salpingo--oophorectomy    PLAN:  We spent approximately 40 minutes going over Elissia's multiple problems but from the point of view of breast cancer she is doing very well, with no evidence of recurrence now 2 years out from her original surgery.  We discussed her sinus issues in detail. Possibly what we are dealing with is not sinusitis but allergies, since it has been no improvement with antibiotics. However I recommended she completed the antibiotic course, add Zyrtec, and keep herself well hydrated.  She is not exercising at all and although she has a gym membership it is really very boring for her and she has not found an exercise, particularly not a weightbearing exercise that she enjoys sufficiently to keep. She was nevertheless encouraged to continue trying in this regard.  We reviewed her bone density, which is stable and the plan is to continue letrozole for a total of 5 years. She is tolerating it well. She will have her mammogram next month and then again in April of 2016 so she will see [place] next late April 2016.  Kendrick Fries  has a good understanding of the overall plan, and agrees with that. She knows to call for any problems that may develop before the next visit  here Chauncey Cruel, MD   12/30/2013 11:46 AM

## 2014-01-01 ENCOUNTER — Telehealth: Payer: Self-pay | Admitting: Physician Assistant

## 2014-01-01 NOTE — Telephone Encounter (Signed)
, °

## 2014-01-03 ENCOUNTER — Other Ambulatory Visit: Payer: Self-pay | Admitting: *Deleted

## 2014-01-03 NOTE — Progress Notes (Signed)
Message left by pt requesting clarification on new appointment made for April of 2015- " I thought it was for next year "  This RN reviewed POF per MD on 12/30/2013 visit and noted request is for 2016.  This RN left message on Melissa's VM in scheduling per above as well as this note will routed to her as well.

## 2014-01-14 ENCOUNTER — Telehealth: Payer: Self-pay | Admitting: Oncology

## 2014-01-14 NOTE — Telephone Encounter (Signed)
alerted by desk nurse val - pt's appts for april 2015 should be april 2016. appt moved accordingly. lmonvm forpt and mailed new schedule.

## 2014-01-29 ENCOUNTER — Other Ambulatory Visit: Payer: Medicare Other

## 2014-02-05 ENCOUNTER — Ambulatory Visit: Payer: Medicare Other | Admitting: Physician Assistant

## 2014-02-12 ENCOUNTER — Encounter (INDEPENDENT_AMBULATORY_CARE_PROVIDER_SITE_OTHER): Payer: Self-pay

## 2014-02-12 ENCOUNTER — Ambulatory Visit
Admission: RE | Admit: 2014-02-12 | Discharge: 2014-02-12 | Disposition: A | Discharge status: Home / Self Care | Payer: Medicare Other | Source: Ambulatory Visit | Attending: Physician Assistant | Admitting: Physician Assistant

## 2014-02-12 DIAGNOSIS — Z853 Personal history of malignant neoplasm of breast: Secondary | ICD-10-CM

## 2014-04-25 ENCOUNTER — Other Ambulatory Visit: Payer: Self-pay | Admitting: *Deleted

## 2014-04-25 DIAGNOSIS — C50419 Malignant neoplasm of upper-outer quadrant of unspecified female breast: Secondary | ICD-10-CM

## 2014-04-25 DIAGNOSIS — C50411 Malignant neoplasm of upper-outer quadrant of right female breast: Secondary | ICD-10-CM

## 2014-04-25 DIAGNOSIS — C50911 Malignant neoplasm of unspecified site of right female breast: Secondary | ICD-10-CM

## 2014-04-25 MED ORDER — ANASTROZOLE 1 MG PO TABS: 1.0000 mg | ORAL_TABLET | Freq: Every day | ORAL | Status: DC

## 2014-07-21 ENCOUNTER — Encounter: Payer: Self-pay | Admitting: Gastroenterology

## 2014-12-02 ENCOUNTER — Telehealth: Payer: Self-pay | Admitting: Oncology

## 2014-12-02 NOTE — Telephone Encounter (Signed)
pt cld left vm wanting to sch appt-cld & left pt a message on vm that appt has been sch and left time & date of appt

## 2015-01-14 ENCOUNTER — Other Ambulatory Visit: Payer: Self-pay

## 2015-01-14 DIAGNOSIS — Z1231 Encounter for screening mammogram for malignant neoplasm of breast: Secondary | ICD-10-CM

## 2015-01-22 ENCOUNTER — Other Ambulatory Visit (HOSPITAL_BASED_OUTPATIENT_CLINIC_OR_DEPARTMENT_OTHER): Payer: Medicare Other

## 2015-01-22 ENCOUNTER — Ambulatory Visit: Payer: Medicare Other | Admitting: Physician Assistant

## 2015-01-22 DIAGNOSIS — C50411 Malignant neoplasm of upper-outer quadrant of right female breast: Secondary | ICD-10-CM

## 2015-01-22 DIAGNOSIS — C50419 Malignant neoplasm of upper-outer quadrant of unspecified female breast: Secondary | ICD-10-CM

## 2015-01-22 LAB — COMPREHENSIVE METABOLIC PANEL (CC13)
ALT: 28 U/L (ref 0–55)
AST: 24 U/L (ref 5–34)
Albumin: 4.1 g/dL (ref 3.5–5.0)
Alkaline Phosphatase: 76 U/L (ref 40–150)
Anion Gap: 9 mEq/L (ref 3–11)
BUN: 20.7 mg/dL (ref 7.0–26.0)
CO2: 25 mEq/L (ref 22–29)
Calcium: 9.1 mg/dL (ref 8.4–10.4)
Chloride: 106 mEq/L (ref 98–109)
Creatinine: 0.9 mg/dL (ref 0.6–1.1)
EGFR: 63 mL/min/{1.73_m2} — ABNORMAL LOW (ref >90)
GLUCOSE: 125 mg/dL (ref 70–140)
Potassium: 4.1 mEq/L (ref 3.5–5.1)
Sodium: 140 mEq/L (ref 136–145)
TOTAL PROTEIN: 7 g/dL (ref 6.4–8.3)
Total Bilirubin: 0.55 mg/dL (ref 0.20–1.20)

## 2015-01-22 LAB — CBC WITH DIFFERENTIAL/PLATELET
BASO%: 1.3 % (ref 0.0–2.0)
BASOS ABS: 0.1 10*3/uL (ref 0.0–0.1)
EOS%: 4.8 % (ref 0.0–7.0)
Eosinophils Absolute: 0.2 10*3/uL (ref 0.0–0.5)
HCT: 40.6 % (ref 34.8–46.6)
HEMOGLOBIN: 13.6 g/dL (ref 11.6–15.9)
LYMPH#: 1.5 10*3/uL (ref 0.9–3.3)
LYMPH%: 29.7 % (ref 14.0–49.7)
MCH: 29.8 pg (ref 25.1–34.0)
MCHC: 33.4 g/dL (ref 31.5–36.0)
MCV: 89 fL (ref 79.5–101.0)
MONO#: 0.5 10*3/uL (ref 0.1–0.9)
MONO%: 9.2 % (ref 0.0–14.0)
NEUT#: 2.8 10*3/uL (ref 1.5–6.5)
NEUT%: 55 % (ref 38.4–76.8)
Platelets: 270 10*3/uL (ref 145–400)
RBC: 4.56 10*6/uL (ref 3.70–5.45)
RDW: 14.2 % (ref 11.2–14.5)
WBC: 5 10*3/uL (ref 3.9–10.3)

## 2015-01-29 ENCOUNTER — Ambulatory Visit (HOSPITAL_BASED_OUTPATIENT_CLINIC_OR_DEPARTMENT_OTHER): Payer: Medicare Other | Admitting: Nurse Practitioner

## 2015-01-29 ENCOUNTER — Telehealth: Payer: Self-pay | Admitting: Nurse Practitioner

## 2015-01-29 ENCOUNTER — Encounter: Payer: Self-pay | Admitting: Nurse Practitioner

## 2015-01-29 VITALS — BP 109/68 | HR 72 | Temp 98.4°F | Resp 18 | Ht 62.0 in | Wt 179.0 lb

## 2015-01-29 DIAGNOSIS — C50411 Malignant neoplasm of upper-outer quadrant of right female breast: Secondary | ICD-10-CM

## 2015-01-29 DIAGNOSIS — M8580 Other specified disorders of bone density and structure, unspecified site: Secondary | ICD-10-CM | POA: Diagnosis not present

## 2015-01-29 DIAGNOSIS — Z170 Estrogen receptor positive status [ER+]: Secondary | ICD-10-CM

## 2015-01-29 NOTE — Telephone Encounter (Signed)
per pof to sch tp appt-pt stated will call & make DEXA and mamma-pt wants specific Dr @ [facility] pt copy of sch

## 2015-01-29 NOTE — Progress Notes (Signed)
ID: Joy Owens OB: [birth year]  MR#: [id number]  CSN#:[id number]  PCP: [name], MD GYN: [name] SU: [name]  OTHER MD: [name], [name],  Christina  Haverstock   CHIEF COMPLAINT: right breast cancer CURRENT TREATMENT: letrozole daily.   BREAST CANCER HISTORY:  From Dr. Collier [name]'s intake note 01/30/2012:  "This patient is the previous good health. She has undergone annual screening mammography. Screening mammogram from 12/21/2011 showed a possible mass in the right breast. She presented on a mammogram in November of 2012 because of a small nodule seen in the right breast. This is felt to be exacerbation cysts. On the current mammogram no other abnormalities were seen. The patient was referred for biopsy. 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 Only a bright T2 lesion midthoracic spine is felt to be a hemangioma."    Her subsequent history ios as detailed below  INTERVAL HISTORY: Joy Owens returns today for follow up of her breast cancer. She has been on letrozole since May 2013 and is tolerating it reasonably well. She has some stiffness first thing in the morning, but improves with movement and meloxicam. She is on allopurinol for the next few months for gout. She has mild hot flashes. She is not exercising regularly, though this was a previous good habit of hers. The interval history is generally unremarkable.  REVIEW OF SYSTEMS: Joy Owens denies fevers, chills, nausea, vomiting, or changes in bowel habits. She has stress urinary incontinence. She does not sleep well and use melatonin or ambien to help with this. She denies shortness of breath, chest pain, cough, or palpitations.  She continues on zoloft and this helps with her anxiety. She denies headaches, vision changes, or dizziness. A detailed review of systems is otherwise stable.  PAST MEDICAL HISTORY: Past Medical History  Diagnosis Date  . Osteopenia   . Anxiety     situational  . Cataract immature   . Hypertension     under control, has been on med. x 10 yrs.  . Diabetes mellitus     diet-controlled  . Seasonal allergies   . Overactive bladder   . Arthritis     hand  . Abrasion of finger 01/24/2012  . Cancer 01/2012    right breast  . Complication of anesthesia 1973    prolonged emergence and no memory of events x 5 days    PAST SURGICAL HISTORY: Past Surgical History  Procedure Laterality Date  . Colonoscopy  2003, 2008, 10/2011  . Polypectomy  2003     with colonoscopy  . Tonsillectomy  1953  . Tubal ligation  1973  . Laparoscopic assisted vaginal hysterectomy  12/01/2009    with BSO, posterior repair  . Dilation and curettage of uterus  01/31/2001    with hysteroscopy  . Cataract extraction      x 1  . Breast mammosite  01/30/2012    Procedure: MAMMOSITE BREAST;  Surgeon: Haywood Lasso, MD;  Location: [facility];  Service: General;  Laterality: Right;   mammosite placement  . Breast surgery    . Breast lumpectomy  01/30/2012    FAMILY HISTORY Family History  Problem Relation Age of Onset  . Cancer Mother     endometrial  .  Cancer Maternal Grandmother     breast  . Hypertension Paternal Grandfather    the patient's father died from complications of Parkinson's disease at the age of 55. The patient's mother died from recurrent endometrial cancer at the age of 70. The patient had one brother and one sister. There is no history of ovarian or breast cancer in the family other than a maternal grandmother who may or may not have had breast cancer.  GYNECOLOGIC HISTORY:  Menarche age 75, first live birth age 37, the patient is [place] P2, stopped having periods approximately  2003, which is also when she stopped having hormone replacement. She also took birth control pills for a period of 9 years off and on, before 1973.  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 She is divorced and shares a home with a female friend. Her daughter Joy Owens lives in [place] and works as a Software engineer. Her son Joy Owens lives in [place] and owns a tree business. The patient has 2 grandchildren. She attends the [employer]. -- Her sister in law , Joy Owens, is also our patient.    ADVANCED DIRECTIVES: In place; the patient tells me she has named her daughter Joy Owens as healthcare part turning. Joy Owens can be contacted at [phone] or [phone].   HEALTH MAINTENANCE: (Updated 07/22/2013) History  Substance Use Topics  . Smoking status: Former Smoker -- 15 years    Types: Cigarettes    Quit date: 10/10/1988  . Smokeless tobacco: Never Used  . Alcohol Use: 0.0 oz/week     Comment: 1 glass wine daily     Colonoscopy: January of 2013  PAP: February of 2013  Bone density: December of 2012, osteopenia  Lipid panel: Dr. Laurann Montana, UTD   Allergies  Allergen Reactions  . Sudafed [Pseudoephedrine Hcl]     Makes pt jittery    Current Outpatient Prescriptions  Medication Sig Dispense Refill  . allopurinol (ZYLOPRIM) 300 MG tablet Take 300 mg by mouth daily.   0  . anastrozole (ARIMIDEX) 1 MG tablet Take 1 tablet (1 mg total) by mouth daily. 90 tablet 3  . aspirin 81 MG tablet Take 81 mg by mouth daily.    . cholecalciferol (VITAMIN D) 1000 UNITS tablet Take 1,000 Units by mouth daily.    . Melatonin 2.5 MG CAPS Take 2.5 mg by mouth at bedtime as needed (insomnia).    Marland Kitchen MICARDIS HCT 40-12.5 MG per tablet 1 tablet daily. AM    . Multiple Vitamins-Minerals (MULTIVITAMIN PO) Take 1 tablet by mouth daily.    . sertraline (ZOLOFT) 25 MG tablet Take 25 mg by mouth daily. PM    .  solifenacin (VESICARE) 5 MG tablet Take 5 mg by mouth daily. AM    . zolpidem (AMBIEN) 5 MG tablet Take 5 mg by mouth as needed.     . calcium carbonate (OS-CAL) 600 MG TABS Take 600 mg by mouth 2 (two) times daily with a meal. With D    . CINNAMON PO Take 1 tablet by mouth daily.    . MELOXICAM PO Take by mouth.    . Phosphatidylserine-DHA-EPA (VAYACOG PO) Take 1 tablet by mouth daily.     No current facility-administered medications for this visit.    OBJECTIVE: Middle-aged white woman who appears stated age 74 Vitals:   01/29/15 1409  BP: 109/68  Pulse: 72  Temp: 98.4 F (36.9  C)  Resp: 18     Body mass index is 32.73 kg/(m^2).    ECOG FS: 1 Filed Weights   01/29/15 1409  Weight: 179 lb (81.194 kg)   Skin: warm, dry  HEENT: sclerae anicteric, conjunctivae pink, oropharynx clear. No thrush or mucositis.  Lymph Nodes: No cervical or supraclavicular lymphadenopathy  Lungs: clear to auscultation bilaterally, no rales, wheezes, or rhonci  Heart: regular rate and rhythm  Abdomen: round, soft, non tender, positive bowel sounds  Musculoskeletal: No focal spinal tenderness, no peripheral edema  Neuro: non focal, well oriented, positive affect  Breasts: right breast status post lumpectomy and radiation. No evidence of recurrent disease. Right axilla benign. Left breast unremarkable.  LAB RESULTS:   Lab Results  Component Value Date   WBC 5.0 01/22/2015   NEUTROABS 2.8 01/22/2015   HGB 13.6 01/22/2015   HCT 40.6 01/22/2015   MCV 89.0 01/22/2015   PLT 270 01/22/2015      Chemistry      Component Value Date/Time   NA 140 01/22/2015 1320   NA 140 03/27/2012 1450   K 4.1 01/22/2015 1320   K 3.6 03/27/2012 1450   CL 101 03/27/2012 1450   CO2 25 01/22/2015 1320   CO2 28 03/27/2012 1450   BUN 20.7 01/22/2015 1320   BUN 26* 03/27/2012 1450   CREATININE 0.9 01/22/2015 1320   CREATININE 1.05 03/27/2012 1450      Component Value Date/Time   CALCIUM 9.1 01/22/2015 1320    CALCIUM 9.8 03/27/2012 1450   ALKPHOS 76 01/22/2015 1320   ALKPHOS 61 03/27/2012 1450   AST 24 01/22/2015 1320   AST 16 03/27/2012 1450   ALT 28 01/22/2015 1320   ALT 14 03/27/2012 1450   BILITOT 0.55 01/22/2015 1320   BILITOT 0.4 03/27/2012 1450       STUDIES:  Most recent bone density at [facility] on 09/21/2011 showed osteopenia.  Most recent bilateral mammogram on 01/14/2013 was unremarkable. Repeat next month as her ready scheduled  ASSESSMENT: 74 y.o. [place] woman   (1)  status is right lumpectomy and sentinel lymph node sampling 01/30/2012 for a pT1c pN0, stage IA invasive ductal carcinoma, grade 1, estrogen receptor 100% and progesterone receptor 97% positive, with no HER-2 amplification and an MIB-1 of 12%.  (2) brachytherapy completed 02/10/2012  (3) letrozole started May 2013  (4) DEXA scan 12th 16 2014 showed stable osteopenia with a T score of -1.3 at the left femoral neck   (5) status post remote total abdominal hysterectomy with bilateral salpingo--oophorectomy    PLAN: Hollye is doing well as far as her breast cancer is concerned. She is now 3 years out from her definitive surgery with no evidence of recurrent disease. She is tolerating the letrozole well and will continue this drug until May 2018 to complete 5 years of antiestrogen therapy. She is due for a mammogram next month, and over due for a bone density scan. I have placed the orders for these to be performed accordingly.   I have encouraged Everlene to search for the motivation to get out and be active. Once upon a time she did the [employer] programs.   Francessca will return in 1 year for labs and a follow up visit. She understands and agrees with this plan. She knows the goal of treatment in her case is cure. She has been encouraged to call with any issues that might arise before her next visit here.  Nira Conn  Michel Bickers, NP   01/29/2015 3:45 PM

## 2015-02-16 ENCOUNTER — Ambulatory Visit
Admission: RE | Admit: 2015-02-16 | Discharge: 2015-02-16 | Disposition: A | Discharge status: Home / Self Care | Payer: Medicare Other | Source: Ambulatory Visit | Attending: Nurse Practitioner | Admitting: Nurse Practitioner

## 2015-02-16 DIAGNOSIS — C50411 Malignant neoplasm of upper-outer quadrant of right female breast: Secondary | ICD-10-CM

## 2015-03-16 ENCOUNTER — Telehealth: Payer: Self-pay | Admitting: *Deleted

## 2015-03-16 NOTE — Telephone Encounter (Signed)
TC from patient requesting her lab work from 01/22/15 be faxed to Dr. Kelton Pillar. She has an appt with her on 03/24/15

## 2015-03-17 ENCOUNTER — Ambulatory Visit: Payer: Medicare Other | Admitting: Oncology

## 2015-03-17 ENCOUNTER — Other Ambulatory Visit: Payer: Medicare Other

## 2015-03-26 ENCOUNTER — Telehealth: Payer: Self-pay

## 2015-03-26 NOTE — Telephone Encounter (Signed)
Pt called asking if Dr Jana Hakim would rx a precautionary antibiotic b/c pt is going to [place] and [place]. Informed pt she would need to call her PCP, Dr Laurann Montana. Pt was amenable to this.

## 2015-04-17 ENCOUNTER — Other Ambulatory Visit: Payer: Self-pay | Admitting: *Deleted

## 2015-04-17 DIAGNOSIS — C50419 Malignant neoplasm of upper-outer quadrant of unspecified female breast: Secondary | ICD-10-CM

## 2015-04-17 DIAGNOSIS — C50411 Malignant neoplasm of upper-outer quadrant of right female breast: Secondary | ICD-10-CM

## 2015-04-17 DIAGNOSIS — C50911 Malignant neoplasm of unspecified site of right female breast: Secondary | ICD-10-CM

## 2015-04-17 MED ORDER — ANASTROZOLE 1 MG PO TABS: 1.0000 mg | ORAL_TABLET | Freq: Every day | ORAL | Status: DC

## 2015-12-18 ENCOUNTER — Other Ambulatory Visit: Payer: Self-pay | Admitting: General Surgery

## 2015-12-18 DIAGNOSIS — N63 Unspecified lump in unspecified breast: Secondary | ICD-10-CM

## 2015-12-24 ENCOUNTER — Ambulatory Visit
Admission: RE | Admit: 2015-12-24 | Discharge: 2015-12-24 | Disposition: A | Discharge status: Home / Self Care | Payer: Medicare Other | Source: Ambulatory Visit | Attending: General Surgery | Admitting: General Surgery

## 2015-12-24 DIAGNOSIS — N63 Unspecified lump in unspecified breast: Secondary | ICD-10-CM

## 2016-02-05 ENCOUNTER — Other Ambulatory Visit: Payer: Self-pay | Admitting: Obstetrics and Gynecology

## 2016-02-05 DIAGNOSIS — Z853 Personal history of malignant neoplasm of breast: Secondary | ICD-10-CM

## 2016-02-09 ENCOUNTER — Telehealth: Payer: Self-pay | Admitting: Oncology

## 2016-02-09 NOTE — Telephone Encounter (Signed)
PAL - R/S FROM 5/17 TO 5/22. Spoke with patient she is aware.

## 2016-02-17 ENCOUNTER — Ambulatory Visit
Admission: RE | Admit: 2016-02-17 | Discharge: 2016-02-17 | Disposition: A | Discharge status: Home / Self Care | Payer: Medicare Other | Source: Ambulatory Visit | Attending: Obstetrics and Gynecology | Admitting: Obstetrics and Gynecology

## 2016-02-17 DIAGNOSIS — Z853 Personal history of malignant neoplasm of breast: Secondary | ICD-10-CM

## 2016-02-24 ENCOUNTER — Other Ambulatory Visit: Payer: Medicare Other

## 2016-02-24 ENCOUNTER — Ambulatory Visit: Payer: Medicare Other | Admitting: Oncology

## 2016-02-29 ENCOUNTER — Telehealth: Payer: Self-pay | Admitting: Oncology

## 2016-02-29 ENCOUNTER — Other Ambulatory Visit: Payer: Self-pay | Admitting: *Deleted

## 2016-02-29 ENCOUNTER — Ambulatory Visit (HOSPITAL_BASED_OUTPATIENT_CLINIC_OR_DEPARTMENT_OTHER): Payer: Medicare Other | Admitting: Oncology

## 2016-02-29 ENCOUNTER — Other Ambulatory Visit (HOSPITAL_BASED_OUTPATIENT_CLINIC_OR_DEPARTMENT_OTHER): Payer: Medicare Other

## 2016-02-29 VITALS — BP 121/83 | HR 72 | Temp 98.5°F | Resp 18 | Ht 62.0 in | Wt 183.2 lb

## 2016-02-29 DIAGNOSIS — C50419 Malignant neoplasm of upper-outer quadrant of unspecified female breast: Secondary | ICD-10-CM

## 2016-02-29 DIAGNOSIS — M8580 Other specified disorders of bone density and structure, unspecified site: Secondary | ICD-10-CM | POA: Diagnosis not present

## 2016-02-29 DIAGNOSIS — C50411 Malignant neoplasm of upper-outer quadrant of right female breast: Secondary | ICD-10-CM | POA: Diagnosis not present

## 2016-02-29 LAB — CBC WITH DIFFERENTIAL/PLATELET
BASO%: 1.4 % (ref 0.0–2.0)
BASOS ABS: 0.1 10*3/uL (ref 0.0–0.1)
EOS%: 3.7 % (ref 0.0–7.0)
Eosinophils Absolute: 0.2 10*3/uL (ref 0.0–0.5)
HEMATOCRIT: 40.4 % (ref 34.8–46.6)
HGB: 13.7 g/dL (ref 11.6–15.9)
LYMPH#: 1.2 10*3/uL (ref 0.9–3.3)
LYMPH%: 23 % (ref 14.0–49.7)
MCH: 30.1 pg (ref 25.1–34.0)
MCHC: 33.9 g/dL (ref 31.5–36.0)
MCV: 88.7 fL (ref 79.5–101.0)
MONO#: 0.5 10*3/uL (ref 0.1–0.9)
MONO%: 9.5 % (ref 0.0–14.0)
NEUT#: 3.3 10*3/uL (ref 1.5–6.5)
NEUT%: 62.4 % (ref 38.4–76.8)
Platelets: 224 10*3/uL (ref 145–400)
RBC: 4.55 10*6/uL (ref 3.70–5.45)
RDW: 13 % (ref 11.2–14.5)
WBC: 5.2 10*3/uL (ref 3.9–10.3)

## 2016-02-29 LAB — COMPREHENSIVE METABOLIC PANEL
ALT: 23 U/L (ref 0–55)
ANION GAP: 8 meq/L (ref 3–11)
AST: 18 U/L (ref 5–34)
Albumin: 4 g/dL (ref 3.5–5.0)
Alkaline Phosphatase: 67 U/L (ref 40–150)
BUN: 18 mg/dL (ref 7.0–26.0)
CALCIUM: 9.5 mg/dL (ref 8.4–10.4)
CO2: 25 meq/L (ref 22–29)
Chloride: 109 mEq/L (ref 98–109)
Creatinine: 1 mg/dL (ref 0.6–1.1)
EGFR: 59 mL/min/{1.73_m2} — ABNORMAL LOW (ref >90)
Glucose: 94 mg/dl (ref 70–140)
POTASSIUM: 4.3 meq/L (ref 3.5–5.1)
Sodium: 143 mEq/L (ref 136–145)
Total Bilirubin: 0.45 mg/dL (ref 0.20–1.20)
Total Protein: 6.9 g/dL (ref 6.4–8.3)

## 2016-02-29 MED ORDER — ANASTROZOLE 1 MG PO TABS: 1.0000 mg | ORAL_TABLET | Freq: Every day | ORAL | Status: AC

## 2016-02-29 NOTE — Telephone Encounter (Signed)
appt made and avs printed °

## 2016-02-29 NOTE — Progress Notes (Signed)
ID: Joy Owens OB: 1941/10/08  MR#: [id number]  DGU#:[id number]  PCP: Joy Casco, MD GYN: Joy Owens SU: Joy Owens  OTHER MD: Joy Owens, Joy Owens,  Joy  Owens   CHIEF COMPLAINT: right breast cancer CURRENT TREATMENT: Anastrozole   BREAST CANCER HISTORY:  From Dr. Collier Salina Owens's intake note 01/30/2012:  "This patient is the previous good health. She has undergone annual screening mammography. Screening mammogram from 12/21/2011 showed a possible mass in the right breast. She presented on a mammogram in November of 2012 because of a small nodule seen in the right breast. This is felt to be exacerbation cysts. On the current mammogram no other abnormalities were seen. The patient was referred for biopsy. 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 Only a bright T2 lesion midthoracic spine is felt to be a hemangioma."    Her subsequent history ios as detailed below  INTERVAL HISTORY: Joy Owens returns today for follow up of her estrogen receptor positive breast cancer. She continues on anastrozole, with excellent tolerance. Hot flashes and vaginal dryness are not major issues. She obtains a drug at a very good price.  REVIEW OF SYSTEMS: Joy Owens insomnia. She describes herself is mildly fatigued she has pain particularly in the hip area. She had an insect bite on her left knee area which has largely resolved. This comes from her gardening, which is rare and her only form of exercise. Sometimes she feels short of breath when walking stairs. She has an overactive bladder. Her gynecologist Dr. Helane Owens is helping with that. She bruises easily. She has arthritis pains here and there, feels  anxious, and has prediabetes, she tells me. What worries are most so is her increase in weight. She has no plan to diet and no plan to exercise however. At home she usually eats precooked stuff and goes out to eat for lunch. A detailed review of systems today was otherwise stable  PAST MEDICAL HISTORY: Past Medical History  Diagnosis Date  . Osteopenia   . Anxiety     situational  . Cataract immature   . Hypertension     under control, has been on med. x 10 yrs.  . Diabetes mellitus     diet-controlled  . Seasonal allergies   . Overactive bladder   . Arthritis     hand  . Abrasion of finger 01/24/2012  . Cancer 01/2012    right breast  . Complication of anesthesia 1973    prolonged emergence and no memory of events x 5 days    PAST SURGICAL HISTORY: Past Surgical History  Procedure Laterality Date  . Colonoscopy  2003, 2008, 10/2011  . Polypectomy  2003     with colonoscopy  . Tonsillectomy  1953  . Tubal ligation  1973  . Laparoscopic assisted vaginal hysterectomy  12/01/2009    with BSO, posterior repair  . Dilation and curettage of uterus  01/31/2001    with hysteroscopy  . Cataract extraction      x 1  . Breast mammosite  01/30/2012    Procedure: MAMMOSITE BREAST;  Surgeon: Joy Lasso, MD;  Location: [facility];  Service: General;  Laterality: Right;   mammosite placement  . Breast surgery    .  Breast lumpectomy  01/30/2012    FAMILY HISTORY Family History  Problem Relation Age of Onset  . Cancer Mother     endometrial  . Cancer Maternal Grandmother     breast  . Hypertension Paternal Grandfather    the patient's father died from complications of Parkinson's disease at the age of 74. The patient's mother died from recurrent endometrial cancer at the age of 38. The patient had one brother and one sister. There is no history of ovarian or breast cancer in the family other than a maternal grandmother who may or may not have had breast  cancer.  GYNECOLOGIC HISTORY:  Menarche age 64, first live birth age 69, the patient is Joy Owens, stopped having periods approximately 2003, which is also when she stopped having hormone replacement. She also took birth control pills for a period of 9 years off and on, before 1973.  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 She is divorced and shares a home with a female friend. Her daughter Joy Owens lives in [place] and works as a Software engineer. Her son Joy Owens lives in [place] and owns a tree business. The patient has 2 grandchildren. She attends the [employer]. -- Her sister in law , Joy Owens, is also our patient.    ADVANCED DIRECTIVES: In place; the patient tells me she has named her daughter Joy Owens as healthcare part turning. Joy Owens can be contacted at [phone] or [phone].   HEALTH MAINTENANCE: (Updated 07/22/2013) Social History  Substance Use Topics  . Smoking status: Former Smoker -- 15 years    Types: Cigarettes    Quit date: 10/10/1988  . Smokeless tobacco: Never Used  . Alcohol Use: 0.0 oz/week     Comment: 1 glass wine daily     Colonoscopy: January of 2013  PAP: February of 2013  Bone density: December of 2012, osteopenia  Lipid panel: Dr. Laurann Owens, UTD   Allergies  Allergen Reactions  . Sudafed [Pseudoephedrine Hcl]     Makes pt jittery    Current Outpatient Prescriptions  Medication Sig Dispense Refill  . allopurinol (ZYLOPRIM) 300 MG tablet Take 300 mg by mouth daily.   0  . anastrozole (ARIMIDEX) 1 MG tablet Take 1 tablet (1 mg total) by mouth daily. 90 tablet 3  . aspirin 81 MG tablet Take 81 mg by mouth daily.    . calcium carbonate (OS-CAL) 600 MG TABS Take 600 mg by mouth 2 (two) times daily with a meal. With D    . cholecalciferol (VITAMIN D) 1000 UNITS tablet Take 1,000 Units by mouth daily.    Marland Kitchen CINNAMON PO Take 1 tablet by mouth daily.    .  Melatonin 2.5 MG CAPS Take 2.5 mg by mouth at bedtime as needed (insomnia).    . MELOXICAM PO Take by mouth.    Marland Kitchen MICARDIS HCT 40-12.5 MG per tablet 1 tablet daily. AM    . Multiple Vitamins-Minerals (MULTIVITAMIN PO) Take 1 tablet by mouth daily.    . Phosphatidylserine-DHA-EPA (VAYACOG PO) Take 1 tablet by mouth daily.    . sertraline (ZOLOFT) 25 MG tablet Take 25 mg by mouth daily. PM    . solifenacin (VESICARE) 5 MG tablet Take 5 mg by mouth daily. AM    . zolpidem (AMBIEN) 5 MG tablet Take 5 mg by mouth as needed.      No current facility-administered medications for this  visit.    OBJECTIVE: Middle-aged white woman In no acute distress Filed Vitals:   02/29/16 1033  BP: 121/83  Pulse: 72  Temp: 98.5 F (36.9 C)  Resp: 18     Body mass index is 33.5 kg/(m^2).    ECOG FS: 1 Filed Weights   02/29/16 1033  Weight: 183 lb 3.2 oz (83.099 kg)   Sclerae unicteric, pupils round and equal Oropharynx clear and moist-- no thrush or other lesions No cervical or supraclavicular adenopathy Lungs no rales or rhonchi Heart regular rate and rhythm Abd soft, nontender, positive bowel sounds MSK no focal spinal tenderness, no upper extremity lymphedema Neuro: nonfocal, well oriented, appropriate affect Breasts: The right breast is status post lumpectomy and radiation. There is no evidence of local recurrence. The right axilla is benign. The left breast is unremarkable. Obese,   LAB RESULTS:   Lab Results  Component Value Date   WBC 5.2 02/29/2016   NEUTROABS 3.3 02/29/2016   HGB 13.7 02/29/2016   HCT 40.4 02/29/2016   MCV 88.7 02/29/2016   PLT 224 02/29/2016      Chemistry      Component Value Date/Time   NA 140 01/22/2015 1320   NA 140 03/27/2012 1450   K 4.1 01/22/2015 1320   K 3.6 03/27/2012 1450   CL 101 03/27/2012 1450   CO2 25 01/22/2015 1320   CO2 28 03/27/2012 1450   BUN 20.7 01/22/2015 1320   BUN 26* 03/27/2012 1450   CREATININE 0.9 01/22/2015 1320    CREATININE 1.05 03/27/2012 1450      Component Value Date/Time   CALCIUM 9.1 01/22/2015 1320   CALCIUM 9.8 03/27/2012 1450   ALKPHOS 76 01/22/2015 1320   ALKPHOS 61 03/27/2012 1450   AST 24 01/22/2015 1320   AST 16 03/27/2012 1450   ALT 28 01/22/2015 1320   ALT 14 03/27/2012 1450   BILITOT 0.55 01/22/2015 1320   BILITOT 0.4 03/27/2012 1450       STUDIES: Mm Diag Breast Tomo Bilateral  02/17/2016  CLINICAL DATA:  History of right breast cancer status post lumpectomy 2013. EXAM: 2D DIGITAL DIAGNOSTIC BILATERAL MAMMOGRAM WITH CAD AND ADJUNCT TOMO COMPARISON:  Previous exam(s). ACR Breast Density Category b: There are scattered areas of fibroglandular density. FINDINGS: No suspicious mass, malignant type microcalcifications or distortion detected in either breast. Mammographic images were processed with CAD. IMPRESSION: No evidence of malignancy in either breast. RECOMMENDATION: Bilateral diagnostic mammogram in 1 year is recommended. I have discussed the findings and recommendations with the patient. Results were also provided in writing at the conclusion of the visit. If applicable, a reminder letter will be sent to the patient regarding the next appointment. BI-RADS CATEGORY  2: Benign. Electronically Signed   By: Lillia Mountain M.D.   On: 02/17/2016 16:13     ASSESSMENT: 75 y.o. [place] woman   (1)  status is right lumpectomy and sentinel lymph node sampling 01/30/2012 for a pT1c pN0, stage IA invasive ductal carcinoma, grade 1, estrogen receptor 100% and progesterone receptor 97% positive, with no HER-2 amplification and an MIB-1 of 12%.  (2) brachytherapy completed 02/10/2012  (3) letrozole started May 2013  (4) DEXA scan 09/24/2013 showed stable osteopenia with a T score of -1.3 at the left femoral neck   (5) status post remote total abdominal hysterectomy with bilateral salpingo--oophorectomy    PLAN: Ariyana is now 4 years out from definitive surgery for her breast cancer  with no evidence of disease recurrence. This is  very favorable area  The plan is to continue anastrozole one more year after which she will "graduate" from breast cancer follow-up.  Her biggest problem however is her weight. She is very concerned about this but not concerned enough to do something about it. We did discuss this for about 20 extra minutes today in addition to her usual 15 minute visit. We discussed a low carb diet and various forms of exercise.   Her problem however is one of motivation. She just cannot get herself to get to the gym even though she has Silver sneakers. She knows all about the "wipes" and how to eat fewer cards, but can't do she understands that unfortunately she may worsen her problems with knee issues, diabetes, worsening blood pressure problems, worsening shortness of breath, etc.  She is aware of the programs we do have available here including yoga and tai chi. These do not motivation.  We will return to this next year, for what likely will be her last visit. I do think she would be a good volunteer here and we will discuss that at that time as well.Marland Kitchen       Chauncey Cruel, MD   02/29/2016 10:54 AM

## 2016-08-31 ENCOUNTER — Encounter: Payer: Self-pay | Admitting: Gastroenterology

## 2016-09-19 ENCOUNTER — Telehealth: Payer: Self-pay | Admitting: Gastroenterology

## 2016-09-19 NOTE — Telephone Encounter (Signed)
Pt has been scheduled for 11/22/16 to discuss colon, she was offered sooner appt but declined and requested Feb.

## 2016-11-08 ENCOUNTER — Ambulatory Visit: Payer: Medicare Other | Admitting: Gastroenterology

## 2016-11-22 ENCOUNTER — Encounter: Payer: Self-pay | Admitting: Gastroenterology

## 2016-11-22 ENCOUNTER — Ambulatory Visit (INDEPENDENT_AMBULATORY_CARE_PROVIDER_SITE_OTHER): Payer: Medicare Other | Admitting: Gastroenterology

## 2016-11-22 ENCOUNTER — Encounter (INDEPENDENT_AMBULATORY_CARE_PROVIDER_SITE_OTHER): Payer: Self-pay

## 2016-11-22 VITALS — BP 120/80 | HR 76 | Ht 62.0 in | Wt 174.0 lb

## 2016-11-22 DIAGNOSIS — R195 Other fecal abnormalities: Secondary | ICD-10-CM

## 2016-11-22 DIAGNOSIS — R152 Fecal urgency: Secondary | ICD-10-CM | POA: Diagnosis not present

## 2016-11-22 MED ORDER — NA SULFATE-K SULFATE-MG SULF 17.5-3.13-1.6 GM/177ML PO SOLN: 1.0000 | Freq: Once | ORAL | 0 refills | Status: AC

## 2016-11-22 NOTE — Progress Notes (Signed)
Review of pertinent gastrointestinal problems: 1. Routine risk for colon cancer:  2003 colonoscopy, Dr. Inocente Salles low Exie Parody, done for routine screening. The text of the report states that it was normal without polyps however there was handwritten note on the report that states "4 mm cecal polyp". Pathology from that procedure proved adenomatous colon polyp. Repeat colonoscopy 2008 Dr. Ardis Hughs showed diverticulosis, 75mm polyp (HP on path).  Colonoscopy 10/2011 Dr. Ardis Hughs; no polyps; recall should be 10 years (according to guidelines current 2018).   HPI: This is a   pleasant 76 year old woman whom I last saw 3 or 4 years ago.  Chief complaint is history of adenomatous colon polyp once, change in bowels, urgency  I sent her a letter recently, explaining current colon cancer screening, polyp surveillance guidelines and that she did not need repeat colonoscopy until 2023.  She said she "wants a colonoscopy."  She is afraid of cancer.    She is avoiding sweet tea, drinks diet teas.  Has started having diarrhea, even explosive stools 3-4 weeks ago.  Urgency for months at least.  No overt bleeding.  She can not point to any particular new medicines that would be contributing. She has not been on any antibiotics recently. She has no significant abdominal pains.  Overall her weight is down a bit.  ROS: complete GI ROS as described in HPI.  Constitutional:  No unintentional weight loss   Past Medical History:  Diagnosis Date  . Abrasion of finger 01/24/2012  . Anxiety    situational  . Arthritis    hand  . Cancer ([place]) 01/2012   right breast  . Cataract immature   . Complication of anesthesia 1973   prolonged emergence and no memory of events x 5 days  . Diabetes mellitus    diet-controlled  . Hypertension    under control, has been on med. x 10 yrs.  . Osteopenia   . Overactive bladder   . Seasonal allergies     Past Surgical History:  Procedure Laterality Date  . BREAST LUMPECTOMY  01/30/2012   . BREAST MAMMOSITE  01/30/2012   Procedure: MAMMOSITE BREAST;  Surgeon: Haywood Lasso, MD;  Location: [facility];  Service: General;  Laterality: Right;   mammosite placement  . BREAST SURGERY    . CATARACT EXTRACTION     x 1  . COLONOSCOPY  2003, 2008, 10/2011  . DILATION AND CURETTAGE OF UTERUS  01/31/2001   with hysteroscopy  . LAPAROSCOPIC ASSISTED VAGINAL HYSTERECTOMY  12/01/2009   with BSO, posterior repair  . POLYPECTOMY  2003    with colonoscopy  . TONSILLECTOMY  1953  . TUBAL LIGATION  1973    Current Outpatient Prescriptions  Medication Sig Dispense Refill  . allopurinol (ZYLOPRIM) 300 MG tablet Take 1 tablet by mouth as needed.  0  . anastrozole (ARIMIDEX) 1 MG tablet Take 1 tablet (1 mg total) by mouth daily. 90 tablet 3  . Phosphatidylserine-DHA-EPA (VAYACOG PO) Take 1 tablet by mouth daily.    . sertraline (ZOLOFT) 25 MG tablet Take 25 mg by mouth daily. PM    . telmisartan (MICARDIS) 20 MG tablet Take 1 tablet by mouth daily.  0  . temazepam (RESTORIL) 30 MG capsule Take 30 mg by mouth at bedtime.  0  . tolterodine (DETROL LA) 4 MG 24 hr capsule Take 4 mg by mouth daily.  0  . aspirin 81 MG tablet Take 81 mg by mouth daily.    . calcium  carbonate (OS-CAL) 600 MG TABS Take 600 mg by mouth 2 (two) times daily with a meal. With D    . cholecalciferol (VITAMIN D) 1000 UNITS tablet Take 1,000 Units by mouth daily.    . Melatonin 2.5 MG CAPS Take 2.5 mg by mouth at bedtime as needed (insomnia).    . Multiple Vitamins-Minerals (MULTIVITAMIN PO) Take 1 tablet by mouth daily.     No current facility-administered medications for this visit.     Allergies as of 11/22/2016 - Review Complete 11/22/2016  Allergen Reaction Noted  . Sudafed [pseudoephedrine hcl]  01/31/2012    Family History  Problem Relation Age of Onset  . Cancer Mother     endometrial  . Cancer Maternal Grandmother     breast  . Hypertension Paternal Grandfather     Social History    Social History  . Marital status: Divorced    Spouse name: N/A  . Number of children: N/A  . Years of education: N/A   Occupational History  . retired Retired   Social History Main Topics  . Smoking status: Former Smoker    Years: 15.00    Types: Cigarettes    Quit date: 10/10/1988  . Smokeless tobacco: Never Used  . Alcohol use 0.0 oz/week     Comment: 1 glass wine daily  . Drug use: No  . Sexual activity: Not on file   Other Topics Concern  . Not on file   Social History Narrative   Retired from the [employer] [place] [employer]    Divorced     Physical Exam: BP 120/80   Pulse 76   Ht 5\' 2"  (1.575 m)   Wt 174 lb (78.9 kg)   BMI 31.83 kg/m  Constitutional: generally well-appearing Psychiatric: alert and oriented x3 Abdomen: soft, nontender, nondistended, no obvious ascites, no peritoneal signs, normal bowel sounds No peripheral edema noted in lower extremities  Assessment and plan: 76 y.o. female with Personal history of adenomatous colon polyp, change in bowels  We had a nice discussion about the nature and evolution of colon cancer screening, polyp surveillance guidelines are reviewed she understands that she is not "do" for screening examination for another 4 or 5 years based on her current guidelines. She has had however change in her bowels over the past several months, some urgency and loose stools and I recommended we proceed with colonoscopy at her soonest convenience for that.  She had a lot of questions and concerns about colonoscopy, our informed consent procedure. I believe I answered them to her satisfaction.  Please see the "Patient Instructions" section for addition details about the plan.  Owens Loffler, MD [facility] 11/22/2016, 3:14 PM

## 2016-11-22 NOTE — Patient Instructions (Signed)
You will be set up for a colonoscopy for changes in your bowels.

## 2016-11-23 ENCOUNTER — Encounter: Payer: Self-pay | Admitting: Gastroenterology

## 2016-11-29 ENCOUNTER — Encounter: Payer: Self-pay | Admitting: Gastroenterology

## 2016-11-29 ENCOUNTER — Ambulatory Visit (AMBULATORY_SURGERY_CENTER): Payer: Medicare Other | Admitting: Gastroenterology

## 2016-11-29 VITALS — BP 139/95 | HR 62 | Temp 98.0°F | Resp 17 | Ht 62.0 in | Wt 174.0 lb

## 2016-11-29 DIAGNOSIS — D123 Benign neoplasm of transverse colon: Secondary | ICD-10-CM

## 2016-11-29 DIAGNOSIS — R195 Other fecal abnormalities: Secondary | ICD-10-CM | POA: Diagnosis not present

## 2016-11-29 DIAGNOSIS — K5730 Diverticulosis of large intestine without perforation or abscess without bleeding: Secondary | ICD-10-CM

## 2016-11-29 MED ORDER — SODIUM CHLORIDE 0.9 % IV SOLN: 500.0000 mL | INTRAVENOUS | Status: AC

## 2016-11-29 NOTE — Progress Notes (Signed)
Called to room to assist during endoscopic procedure.  Patient ID and intended procedure confirmed with present staff. Received instructions for my participation in the procedure from the performing physician.  

## 2016-11-29 NOTE — Patient Instructions (Signed)
YOU HAD AN ENDOSCOPIC PROCEDURE TODAY AT THE [facility]:   Refer to the procedure report that was given to you for any specific questions about what was found during the examination.  If the procedure report does not answer your questions, please call your gastroenterologist to clarify.  If you requested that your care partner not be given the details of your procedure findings, then the procedure report has been included in a sealed envelope for you to review at your convenience later.  YOU SHOULD EXPECT: Some feelings of bloating in the abdomen. Passage of more gas than usual.  Walking can help get rid of the air that was put into your GI tract during the procedure and reduce the bloating. If you had a lower endoscopy (such as a colonoscopy or flexible sigmoidoscopy) you may notice spotting of blood in your stool or on the toilet paper. If you underwent a bowel prep for your procedure, you may not have a normal bowel movement for a few days.  Please Note:  You might notice some irritation and congestion in your nose or some drainage.  This is from the oxygen used during your procedure.  There is no need for concern and it should clear up in a day or so.  SYMPTOMS TO REPORT IMMEDIATELY:   Following lower endoscopy (colonoscopy or flexible sigmoidoscopy):  Excessive amounts of blood in the stool  Significant tenderness or worsening of abdominal pains  Swelling of the abdomen that is new, acute  Fever of 100F or higher    For urgent or emergent issues, a gastroenterologist can be reached at any hour by calling [phone].   DIET:  We do recommend a small meal at first, but then you may proceed to your regular diet.  Drink plenty of fluids but you should avoid alcoholic beverages for 24 hours.  ACTIVITY:  You should plan to take it easy for the rest of today and you should NOT DRIVE or use heavy machinery until tomorrow (because of the sedation medicines used during the test).     FOLLOW UP: Our staff will call the number listed on your records the next business day following your procedure to check on you and address any questions or concerns that you may have regarding the information given to you following your procedure. If we do not reach you, we will leave a message.  However, if you are feeling well and you are not experiencing any problems, there is no need to return our call.  We will assume that you have returned to your regular daily activities without incident.  If any biopsies were taken you will be contacted by phone or by letter within the next 1-3 weeks.  Please call us at [phone] if you have not heard about the biopsies in 3 weeks.    SIGNATURES/CONFIDENTIALITY: You and/or your care partner have signed paperwork which will be entered into your electronic medical record.  These signatures attest to the fact that that the information above on your After Visit Summary has been reviewed and is understood.  Full responsibility of the confidentiality of this discharge information lies with you and/or your care-partner.   Resume medications. Information given on polyps and diverticulosis. 

## 2016-11-29 NOTE — Progress Notes (Signed)
A/ox3 pleased with MAC, report to [facility]

## 2016-11-29 NOTE — Op Note (Signed)
[place] Patient Name: Joy Owens Procedure Date: 11/29/2016 2:27 PM MRN: [id number] Endoscopist: Milus Banister , MD Age: 76 Referring MD:  Date of Birth: [birth year] Gender: Female Account #: [id number] Procedure:                Colonoscopy Indications:              Change in bowel habits; loose stools, urgency; 2003                            colonoscopy, Dr. Lyla Son, done for routine                            screening. The text of the report states that it                            was normal without polyps however there was                            handwritten note on the report that stated "4 mm                            cecal polyp". Pathology from that procedure proved                            adenomatous colon polyp. Repeat colonoscopy 2008                            Dr. Ardis Hughs showed diverticulosis, 61mm polyp (HP on                            path). Colonoscopy 10/2011 Dr. Ardis Hughs; no polyps;                            recall should be 10 years (according to guidelines                            current as of 2018). Medicines:                Monitored Anesthesia Care Procedure:                Pre-Anesthesia Assessment:                           - Prior to the procedure, a History and Physical                            was performed, and patient medications and                            allergies were reviewed. The patient's tolerance of                            previous anesthesia was also reviewed. The risks  and benefits of the procedure and the sedation                            options and risks were discussed with the patient.                            All questions were answered, and informed consent                            was obtained. Prior Anticoagulants: The patient has                            taken no previous anticoagulant or antiplatelet                            agents. ASA Grade Assessment:  II - A patient with                            mild systemic disease. After reviewing the risks                            and benefits, the patient was deemed in                            satisfactory condition to undergo the procedure.                           After obtaining informed consent, the colonoscope                            was passed under direct vision. Throughout the                            procedure, the patient's blood pressure, pulse, and                            oxygen saturations were monitored continuously. The                            Model PCF-H190L [id number]) scope was introduced                            through the anus and advanced to the the cecum,                            identified by appendiceal orifice and ileocecal                            valve. The patient tolerated the procedure well.                            The colonoscopy was somewhat difficult. The patient  tolerated the procedure well. The quality of the                            bowel preparation was good. The ileocecal valve,                            appendiceal orifice, and rectum were photographed. Scope In: 2:37:49 PM Scope Out: 2:50:44 PM Scope Withdrawal Time: 0 hours 9 minutes 36 seconds  Total Procedure Duration: 0 hours 12 minutes 55 seconds  Findings:                 A 5 mm polyp was found in the transverse colon. The                            polyp was sessile. The polyp was removed with a                            cold snare. Resection and retrieval were complete.                           Multiple small and large-mouthed diverticula were                            found in the left colon. There was narrowing of the                            colon in association with the diverticular                            openings, thickening of the mucosa in that segement                            as well..                           The exam  was otherwise without abnormality on                            direct and retroflexion views.                           Biopsies for histology were taken with a cold                            forceps from the entire colon for evaluation of                            microscopic colitis. Two of the biopsy sites,                            located 45cm from the anus, bled more than is usual                            following  forceps biopsy. The bleeding stopped with                            observation for 1-2 minutes, saline flushes. Complications:            No immediate complications. Estimated blood loss:                            None. Estimated Blood Loss:     Estimated blood loss: none. Impression:               - One 5 mm polyp in the transverse colon, removed                            with a cold snare. Resected and retrieved.                           - Moderate diverticulosis in the left colon. There                            was narrowing of the colon in association with the                            diverticular opening.                           - The examination was otherwise normal on direct                            and retroflexion views.                           - Biopsies were taken with a cold forceps from the                            entire colon for evaluation of microscopic colitis. Recommendation:           - Patient has a contact number available for                            emergencies. The signs and symptoms of potential                            delayed complications were discussed with the                            patient. Return to normal activities tomorrow.                            Written discharge instructions were provided to the                            patient.                           - Resume previous diet.                           -  Continue present medications.                           You will receive a letter within 2-3  weeks with the                            pathology results and my final recommendations.                           If the polyp(s) is proven to be 'pre-cancerous' on                            pathology, you will need repeat colonoscopy in 5                            years. Milus Banister, MD 11/29/2016 2:57:17 PM This report has been signed electronically.

## 2016-11-30 ENCOUNTER — Telehealth: Payer: Self-pay | Admitting: Gastroenterology

## 2016-11-30 ENCOUNTER — Telehealth: Payer: Self-pay | Admitting: *Deleted

## 2016-11-30 NOTE — Telephone Encounter (Signed)
Left message on f/u call 

## 2016-12-01 NOTE — Telephone Encounter (Signed)
Pt is calling back about the report that was given to her

## 2016-12-02 NOTE — Telephone Encounter (Signed)
Patient calling again stating that she still has not received a call about her message from 11/30/16.

## 2016-12-02 NOTE — Telephone Encounter (Signed)
The pt has been advised that her colon was redundant and just more difficult to move the scope around.  The pt asked if anything needed to be done and she was advised nothing needed to be done.

## 2016-12-06 ENCOUNTER — Encounter: Payer: Self-pay | Admitting: Gastroenterology

## 2017-01-31 ENCOUNTER — Other Ambulatory Visit: Payer: Self-pay | Admitting: Oncology

## 2017-01-31 DIAGNOSIS — R920 Mammographic microcalcification found on diagnostic imaging of breast: Secondary | ICD-10-CM

## 2017-02-21 ENCOUNTER — Other Ambulatory Visit: Payer: Self-pay | Admitting: Oncology

## 2017-02-21 ENCOUNTER — Other Ambulatory Visit: Payer: Medicare Other

## 2017-02-21 DIAGNOSIS — Z853 Personal history of malignant neoplasm of breast: Secondary | ICD-10-CM

## 2017-02-22 ENCOUNTER — Ambulatory Visit
Admission: RE | Admit: 2017-02-22 | Discharge: 2017-02-22 | Disposition: A | Discharge status: Home / Self Care | Payer: Medicare Other | Source: Ambulatory Visit | Attending: Oncology | Admitting: Oncology

## 2017-02-22 DIAGNOSIS — Z853 Personal history of malignant neoplasm of breast: Secondary | ICD-10-CM

## 2017-02-22 HISTORY — DX: Personal history of irradiation: Z92.3

## 2017-02-28 ENCOUNTER — Other Ambulatory Visit (HOSPITAL_BASED_OUTPATIENT_CLINIC_OR_DEPARTMENT_OTHER): Payer: Medicare Other

## 2017-02-28 ENCOUNTER — Ambulatory Visit (HOSPITAL_BASED_OUTPATIENT_CLINIC_OR_DEPARTMENT_OTHER): Payer: Medicare Other | Admitting: Oncology

## 2017-02-28 VITALS — BP 127/86 | HR 71 | Temp 97.9°F | Resp 20 | Ht 62.0 in | Wt 176.2 lb

## 2017-02-28 DIAGNOSIS — C50411 Malignant neoplasm of upper-outer quadrant of right female breast: Secondary | ICD-10-CM

## 2017-02-28 DIAGNOSIS — M8580 Other specified disorders of bone density and structure, unspecified site: Secondary | ICD-10-CM | POA: Diagnosis not present

## 2017-02-28 DIAGNOSIS — C50419 Malignant neoplasm of upper-outer quadrant of unspecified female breast: Secondary | ICD-10-CM

## 2017-02-28 LAB — CBC WITH DIFFERENTIAL/PLATELET
BASO%: 0.8 % (ref 0.0–2.0)
BASOS ABS: 0.1 10*3/uL (ref 0.0–0.1)
EOS%: 3.8 % (ref 0.0–7.0)
Eosinophils Absolute: 0.2 10*3/uL (ref 0.0–0.5)
HCT: 40.9 % (ref 34.8–46.6)
HEMOGLOBIN: 13.9 g/dL (ref 11.6–15.9)
LYMPH#: 1.3 10*3/uL (ref 0.9–3.3)
LYMPH%: 21.2 % (ref 14.0–49.7)
MCH: 31 pg (ref 25.1–34.0)
MCHC: 34 g/dL (ref 31.5–36.0)
MCV: 91.1 fL (ref 79.5–101.0)
MONO#: 0.5 10*3/uL (ref 0.1–0.9)
MONO%: 8.2 % (ref 0.0–14.0)
NEUT#: 4 10*3/uL (ref 1.5–6.5)
NEUT%: 66 % (ref 38.4–76.8)
Platelets: 219 10*3/uL (ref 145–400)
RBC: 4.49 10*6/uL (ref 3.70–5.45)
RDW: 13.6 % (ref 11.2–14.5)
WBC: 6 10*3/uL (ref 3.9–10.3)

## 2017-02-28 LAB — COMPREHENSIVE METABOLIC PANEL
ALT: 23 U/L (ref 0–55)
ANION GAP: 9 meq/L (ref 3–11)
AST: 16 U/L (ref 5–34)
Albumin: 4 g/dL (ref 3.5–5.0)
Alkaline Phosphatase: 80 U/L (ref 40–150)
BUN: 16.6 mg/dL (ref 7.0–26.0)
CALCIUM: 9.5 mg/dL (ref 8.4–10.4)
CO2: 25 meq/L (ref 22–29)
CREATININE: 0.8 mg/dL (ref 0.6–1.1)
Chloride: 109 mEq/L (ref 98–109)
EGFR: 69 mL/min/{1.73_m2} — AB (ref >90)
Glucose: 109 mg/dl (ref 70–140)
Potassium: 4.4 mEq/L (ref 3.5–5.1)
Sodium: 142 mEq/L (ref 136–145)
Total Bilirubin: 0.54 mg/dL (ref 0.20–1.20)
Total Protein: 6.9 g/dL (ref 6.4–8.3)

## 2017-02-28 NOTE — Progress Notes (Signed)
ID: Joy Owens OB: 1941/01/13  MR#: [id number]  CSN#:[id number]  PCP: Kelton Pillar, MD GYN: [name] SU: [name]  OTHER MD: [name], [name],  Christina  Haverstock   CHIEF COMPLAINT: right breast cancer  CURRENT TREATMENT: Completing 5 years on Anastrozole   BREAST CANCER HISTORY:  From Dr. Collier [name]'s intake note 01/30/2012:  "This patient is the previous good health. She has undergone annual screening mammography. Screening mammogram from 12/21/2011 showed a possible mass in the right breast. She presented on a mammogram in November of 2012 because of a small nodule seen in the right breast. This is felt to be exacerbation cysts. On the current mammogram no other abnormalities were seen. The patient was referred for biopsy. 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 Only a bright T2 lesion midthoracic spine is felt to be a hemangioma."    Her subsequent history ios as detailed below  INTERVAL HISTORY: Joy Owens returns today for follow-up of her estrogen receptor positive breast cancer. She continues on letrozole. Hot flashes and vaginal dryness or not a problem. However she has occasional problems in her hands, although she also states she has a history of falls and has injured her hands that way. Sometimes she has pain in her lower legs. She is not sure if she this is due to letrozole or not. We discussed the possibility that it may be due to the fact that her shoes are rather flat no support.   REVIEW OF SYSTEMS: [name] i tells me she had a mass that was felt that everybody to be a sebaceous cyst but it turned out to be something else when removed by dermatology. She does not recall  what it was and I cannot located in Epic but She Tells Me Whatever It Wasn't Was Benign. She Has Seasonal Allergy Problems, Recently Underwent a Root Canal. Had a Colonoscopy February 2018 Showed No Evidence of Colon Cancer, Has Problems with Stress Urinary Incontinence, Suffers from Psoriasis, Bruises Easily, but All In All She Feels Everything Is "about the Same As before". A Detailed Review of Systems Was Otherwise Noncontributory  PAST MEDICAL HISTORY: Past Medical History:  Diagnosis Date  . Abrasion of finger 01/24/2012  . Anxiety    situational  . Arthritis    hand  . Cancer ([place]) 01/2012   right breast  . Cataract immature   . Complication of anesthesia 1973   prolonged emergence and no memory of events x 5 days  . Diabetes mellitus    diet-controlled  . Hypertension    under control, has been on med. x 10 yrs.  . Osteopenia   . Overactive bladder   . Personal history of radiation therapy   . Seasonal allergies     PAST SURGICAL HISTORY: Past Surgical History:  Procedure Laterality Date  . BREAST BIOPSY Left 12/30/2011  . BREAST CYST ASPIRATION  04/06/2012  . BREAST LUMPECTOMY Left 01/30/2012  . BREAST MAMMOSITE  01/30/2012   Procedure: MAMMOSITE BREAST;  Surgeon: Haywood Lasso, MD;  Location: [facility];  Service: General;  Laterality: Right;   mammosite placement  . BREAST SURGERY    . CATARACT EXTRACTION     x 1  . COLONOSCOPY  2003,  2008, 10/2011  . DILATION AND CURETTAGE OF UTERUS  01/31/2001   with hysteroscopy  . LAPAROSCOPIC ASSISTED VAGINAL HYSTERECTOMY  12/01/2009   with BSO, posterior repair  . POLYPECTOMY  2003    with colonoscopy  . TONSILLECTOMY  1953  . TUBAL LIGATION  1973    FAMILY HISTORY Family History  Problem Relation Age of Onset  . Cancer Mother        endometrial  . Cancer Maternal Grandmother        breast  . Hypertension Paternal Grandfather    the patient's father died from complications of Parkinson's disease at  the age of 35. The patient's mother died from recurrent endometrial cancer at the age of 73. The patient had one brother and one sister. There is no history of ovarian or breast cancer in the family other than a maternal grandmother who may or may not have had breast cancer.  GYNECOLOGIC HISTORY:  Menarche age 26, first live birth age 39, the patient is [place] P2, stopped having periods approximately 2003, which is also when she stopped having hormone replacement. She also took birth control pills for a period of 9 years off and on, before 1973.  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 She is divorced and shares a home with a female friend. Her daughter Joy Owens lives in [place] and works as a Software engineer. Her son Joy Owens lives in [place] and owns a tree business. The patient has 2 grandchildren. She attends the [employer]. -- Her sister in law , [name], is also our patient.    ADVANCED DIRECTIVES: In place; the patient tells me she has named her daughter Joy Owens as healthcare part turning. Joy Owens can be contacted at [phone] or [phone].   HEALTH MAINTENANCE: (Updated 07/22/2013) Social History  Substance Use Topics  . Smoking status: Former Smoker    Years: 15.00    Types: Cigarettes    Quit date: 10/10/1988  . Smokeless tobacco: Never Used  . Alcohol use 0.0 oz/week     Comment: 1 glass wine daily     Colonoscopy: January of 2013  PAP: February of 2013  Bone density: December of 2012, osteopenia  Lipid panel: Dr. Laurann Montana, UTD   Allergies  Allergen Reactions  . Sudafed [Pseudoephedrine Hcl]     Makes pt jittery    Current Outpatient Prescriptions  Medication Sig Dispense Refill  . allopurinol (ZYLOPRIM) 300 MG tablet Take 1 tablet by mouth as needed.  0  . anastrozole (ARIMIDEX) 1 MG tablet Take 1 tablet (1 mg total) by mouth daily. 90 tablet 3  . calcium carbonate  (OS-CAL) 600 MG TABS Take 600 mg by mouth 2 (two) times daily with a meal. With D    . cholecalciferol (VITAMIN D) 1000 UNITS tablet Take 1,000 Units by mouth daily.    . Melatonin 2.5 MG CAPS Take 2.5 mg by mouth at bedtime as needed (insomnia).    . Multiple Vitamins-Minerals (MULTIVITAMIN PO) Take 1 tablet by mouth daily.    . Phosphatidylserine-DHA-EPA (VAYACOG PO) Take 1 tablet by mouth daily.    . sertraline (ZOLOFT) 25 MG tablet Take 25 mg by mouth daily. PM    . telmisartan (MICARDIS) 20 MG tablet Take 1 tablet by mouth daily.  0  . temazepam (RESTORIL) 30 MG capsule Take 30 mg by mouth at bedtime.  0  . tolterodine (DETROL LA)  4 MG 24 hr capsule Take 4 mg by mouth daily.  0   Current Facility-Administered Medications  Medication Dose Route Frequency Provider Last Rate Last Dose  . 0.9 %  sodium chloride infusion  500 mL Intravenous Continuous Milus Banister, MD        OBJECTIVE: Middle-aged white womanWho appears stated age 19:   02/28/17 1333  BP: 127/86  Pulse: 71  Resp: 20  Temp: 97.9 F (36.6 C)     Body mass index is 32.23 kg/m.    ECOG FS: 1 Filed Weights   02/28/17 1333  Weight: 176 lb 3.2 oz (79.9 kg)   Sclerae unicteric, EOMs intact Oropharynx clear and moist No cervical or supraclavicular adenopathy Lungs no rales or rhonchi Heart regular rate and rhythm Abd soft, nontender, positive bowel sounds MSK no focal spinal tenderness, no upper extremity lymphedema Neuro: nonfocal, well oriented, appropriate affect Breasts: The right breast is status post lumpectomy and radiation with no evidence of local recurrence. The left breast is benign. Both axillae are benign.  LAB RESULTS:   Lab Results  Component Value Date   WBC 6.0 02/28/2017   NEUTROABS 4.0 02/28/2017   HGB 13.9 02/28/2017   HCT 40.9 02/28/2017   MCV 91.1 02/28/2017   PLT 219 02/28/2017      Chemistry      Component Value Date/Time   NA 142 02/28/2017 1302   K 4.4 02/28/2017 1302    CL 101 03/27/2012 1450   CO2 25 02/28/2017 1302   BUN 16.6 02/28/2017 1302   CREATININE 0.8 02/28/2017 1302      Component Value Date/Time   CALCIUM 9.5 02/28/2017 1302   ALKPHOS 80 02/28/2017 1302   AST 16 02/28/2017 1302   ALT 23 02/28/2017 1302   BILITOT 0.54 02/28/2017 1302       STUDIES: Mm Diag Breast Tomo Bilateral  Result Date: 02/22/2017 CLINICAL DATA:  76 year old female with history of right breast cancer post lumpectomy 01/30/2012. Since the patient's last mammogram from 2017 she has had a removal of a benign cystic lesion in the skin of the inner right breast by her dermatologist. EXAM: 2D DIGITAL DIAGNOSTIC BILATERAL MAMMOGRAM WITH CAD AND ADJUNCT TOMO COMPARISON:  Previous exam(s). ACR Breast Density Category b: There are scattered areas of fibroglandular density. FINDINGS: No suspicious masses or calcifications are seen in either breast. Postsurgical changes are again seen in the upper-outer posterior right breast related to prior lumpectomy. Spot compression magnification CC view of the lumpectomy site in the right breast was performed. There is no mammographic evidence of locally recurrent malignancy. Mammographic images were processed with CAD. IMPRESSION: No mammographic evidence of malignancy in either breast. RECOMMENDATION: Diagnostic mammogram is suggested in 1 year. (Code:DM-B-01Y) I have discussed the findings and recommendations with the patient. Results were also provided in writing at the conclusion of the visit. If applicable, a reminder letter will be sent to the patient regarding the next appointment. BI-RADS CATEGORY  2: Benign. Electronically Signed   By: Everlean Alstrom M.D.   On: 02/22/2017 16:45     ASSESSMENT: 76 y.o. [place] woman   (1)  status is right lumpectomy and sentinel lymph node sampling 01/30/2012 for a pT1c pN0, stage IA invasive ductal carcinoma, grade 1, estrogen receptor 100% and progesterone receptor 97% positive, with no HER-2  amplification and an MIB-1 of 12%.  (2) brachytherapy completed 02/10/2012  (3) letrozole started May 2013, completing 5 years May 2018  (4) DEXA scan 09/24/2013 showed stable  osteopenia with a T score of -1.3 at the left femoral neck   (5) status post remote total abdominal hysterectomy with bilateral salpingo--oophorectomy    PLAN: Anastaisa is now just over 5 years out from definitive surgery for her breast cancer with no evidence of disease recurrence. This is very favorable.  She is completing 5 years of letrozole this month. This has cut in half her risk of recurrence and also cut in half her risk of developing another breast cancer in the future.  The possible benefit from continuing another 5 years on letrozole is in the range of 1%. This is not a motivating factor for her.  Accordingly she is being released from follow-up here at this time. We'll she will need in terms of breast cancer recurrence is yearly mammography and a yearly physician breast exam.  I will be glad to see Tonye at any point in the future if on when the need arises but as of now are making further routine appointments for her here      Chauncey Cruel, MD   02/28/2017 2:03 PM

## 2017-06-05 ENCOUNTER — Telehealth: Payer: Self-pay | Admitting: Gastroenterology

## 2017-06-06 NOTE — Telephone Encounter (Signed)
Returned patients call.  She had several suggestions for improvement in our process and was very concerned that she did not receive a survey afterwards.  I addressed her concerns to the best of my ability. She had a question about the coding of her procedure and I explained to her that I would route this message to someone who could better answer her questions.   Will route to Alisia Ferrari.

## 2018-02-23 ENCOUNTER — Other Ambulatory Visit: Payer: Self-pay | Admitting: Oncology

## 2018-02-23 ENCOUNTER — Other Ambulatory Visit: Payer: Self-pay | Admitting: Obstetrics and Gynecology

## 2018-02-23 DIAGNOSIS — Z1231 Encounter for screening mammogram for malignant neoplasm of breast: Secondary | ICD-10-CM

## 2018-03-16 ENCOUNTER — Ambulatory Visit: Payer: Medicare Other

## 2018-03-16 ENCOUNTER — Ambulatory Visit
Admission: RE | Admit: 2018-03-16 | Discharge: 2018-03-16 | Disposition: A | Discharge status: Home / Self Care | Payer: Medicare Other | Source: Ambulatory Visit | Attending: Obstetrics and Gynecology | Admitting: Obstetrics and Gynecology

## 2018-03-16 DIAGNOSIS — Z1231 Encounter for screening mammogram for malignant neoplasm of breast: Secondary | ICD-10-CM

## 2018-03-19 ENCOUNTER — Ambulatory Visit: Payer: Medicare Other

## 2018-09-28 ENCOUNTER — Ambulatory Visit
Admission: RE | Admit: 2018-09-28 | Discharge: 2018-09-28 | Disposition: A | Discharge status: Home / Self Care | Payer: Medicare Other | Source: Ambulatory Visit | Attending: Physician Assistant | Admitting: Physician Assistant

## 2018-09-28 ENCOUNTER — Other Ambulatory Visit: Payer: Self-pay | Admitting: Physician Assistant

## 2018-09-28 DIAGNOSIS — R05 Cough: Secondary | ICD-10-CM

## 2018-09-28 DIAGNOSIS — R053 Chronic cough: Secondary | ICD-10-CM

## 2019-03-12 ENCOUNTER — Other Ambulatory Visit: Payer: Self-pay | Admitting: Obstetrics and Gynecology

## 2019-03-12 DIAGNOSIS — Z1231 Encounter for screening mammogram for malignant neoplasm of breast: Secondary | ICD-10-CM

## 2019-04-29 ENCOUNTER — Other Ambulatory Visit: Payer: Self-pay

## 2019-04-29 ENCOUNTER — Ambulatory Visit
Admission: RE | Admit: 2019-04-29 | Discharge: 2019-04-29 | Disposition: A | Discharge status: Home / Self Care | Payer: Medicare Other | Source: Ambulatory Visit | Attending: Obstetrics and Gynecology | Admitting: Obstetrics and Gynecology

## 2019-04-29 DIAGNOSIS — Z1231 Encounter for screening mammogram for malignant neoplasm of breast: Secondary | ICD-10-CM

## 2019-09-03 ENCOUNTER — Other Ambulatory Visit: Payer: Self-pay | Admitting: *Deleted

## 2019-09-03 DIAGNOSIS — Z20822 Contact with and (suspected) exposure to covid-19: Secondary | ICD-10-CM

## 2019-09-04 LAB — NOVEL CORONAVIRUS, NAA: SARS-CoV-2, NAA: NOT DETECTED

## 2019-10-25 ENCOUNTER — Telehealth: Payer: Self-pay

## 2019-10-25 NOTE — Telephone Encounter (Signed)
Patient is not happy that we are booked for covid vaccines and would like to speak with my Freight forwarder. Told patient that I would send a message to my supervisor Rhae Lerner.

## 2020-01-13 ENCOUNTER — Other Ambulatory Visit: Payer: Self-pay | Admitting: Obstetrics and Gynecology

## 2020-01-13 DIAGNOSIS — Z1231 Encounter for screening mammogram for malignant neoplasm of breast: Secondary | ICD-10-CM

## 2020-03-16 DIAGNOSIS — M8588 Other specified disorders of bone density and structure, other site: Secondary | ICD-10-CM | POA: Diagnosis not present

## 2020-03-16 DIAGNOSIS — Z6833 Body mass index (BMI) 33.0-33.9, adult: Secondary | ICD-10-CM | POA: Diagnosis not present

## 2020-03-16 DIAGNOSIS — Z124 Encounter for screening for malignant neoplasm of cervix: Secondary | ICD-10-CM | POA: Diagnosis not present

## 2020-03-16 DIAGNOSIS — N3281 Overactive bladder: Secondary | ICD-10-CM | POA: Diagnosis not present

## 2020-04-30 ENCOUNTER — Ambulatory Visit
Admission: RE | Admit: 2020-04-30 | Discharge: 2020-04-30 | Disposition: A | Discharge status: Home / Self Care | Payer: Medicare PPO | Source: Ambulatory Visit | Attending: Obstetrics and Gynecology | Admitting: Obstetrics and Gynecology

## 2020-04-30 ENCOUNTER — Other Ambulatory Visit: Payer: Self-pay

## 2020-04-30 DIAGNOSIS — Z1231 Encounter for screening mammogram for malignant neoplasm of breast: Secondary | ICD-10-CM

## 2020-05-28 DIAGNOSIS — H53142 Visual discomfort, left eye: Secondary | ICD-10-CM | POA: Diagnosis not present

## 2020-05-28 DIAGNOSIS — E119 Type 2 diabetes mellitus without complications: Secondary | ICD-10-CM | POA: Diagnosis not present

## 2020-05-28 DIAGNOSIS — Z961 Presence of intraocular lens: Secondary | ICD-10-CM | POA: Diagnosis not present

## 2020-05-28 DIAGNOSIS — H40013 Open angle with borderline findings, low risk, bilateral: Secondary | ICD-10-CM | POA: Diagnosis not present

## 2020-06-03 DIAGNOSIS — D225 Melanocytic nevi of trunk: Secondary | ICD-10-CM | POA: Diagnosis not present

## 2020-06-03 DIAGNOSIS — L578 Other skin changes due to chronic exposure to nonionizing radiation: Secondary | ICD-10-CM | POA: Diagnosis not present

## 2020-06-03 DIAGNOSIS — Z808 Family history of malignant neoplasm of other organs or systems: Secondary | ICD-10-CM | POA: Diagnosis not present

## 2020-06-03 DIAGNOSIS — L821 Other seborrheic keratosis: Secondary | ICD-10-CM | POA: Diagnosis not present

## 2020-06-03 DIAGNOSIS — L814 Other melanin hyperpigmentation: Secondary | ICD-10-CM | POA: Diagnosis not present

## 2020-06-03 DIAGNOSIS — L304 Erythema intertrigo: Secondary | ICD-10-CM | POA: Diagnosis not present

## 2020-06-03 DIAGNOSIS — L720 Epidermal cyst: Secondary | ICD-10-CM | POA: Diagnosis not present

## 2020-06-03 DIAGNOSIS — L219 Seborrheic dermatitis, unspecified: Secondary | ICD-10-CM | POA: Diagnosis not present

## 2020-07-30 DIAGNOSIS — N182 Chronic kidney disease, stage 2 (mild): Secondary | ICD-10-CM | POA: Diagnosis not present

## 2020-07-30 DIAGNOSIS — E1121 Type 2 diabetes mellitus with diabetic nephropathy: Secondary | ICD-10-CM | POA: Diagnosis not present

## 2020-07-30 DIAGNOSIS — M6281 Muscle weakness (generalized): Secondary | ICD-10-CM | POA: Diagnosis not present

## 2020-07-30 DIAGNOSIS — E7800 Pure hypercholesterolemia, unspecified: Secondary | ICD-10-CM | POA: Diagnosis not present

## 2020-07-30 DIAGNOSIS — I129 Hypertensive chronic kidney disease with stage 1 through stage 4 chronic kidney disease, or unspecified chronic kidney disease: Secondary | ICD-10-CM | POA: Diagnosis not present

## 2020-07-30 DIAGNOSIS — Z7189 Other specified counseling: Secondary | ICD-10-CM | POA: Diagnosis not present

## 2020-07-30 DIAGNOSIS — M109 Gout, unspecified: Secondary | ICD-10-CM | POA: Diagnosis not present

## 2020-11-05 DIAGNOSIS — B354 Tinea corporis: Secondary | ICD-10-CM | POA: Diagnosis not present

## 2020-11-05 DIAGNOSIS — Z7189 Other specified counseling: Secondary | ICD-10-CM | POA: Diagnosis not present

## 2020-11-05 DIAGNOSIS — R609 Edema, unspecified: Secondary | ICD-10-CM | POA: Diagnosis not present

## 2020-12-03 DIAGNOSIS — N760 Acute vaginitis: Secondary | ICD-10-CM | POA: Diagnosis not present

## 2021-02-04 DIAGNOSIS — Z1389 Encounter for screening for other disorder: Secondary | ICD-10-CM | POA: Diagnosis not present

## 2021-02-04 DIAGNOSIS — E1121 Type 2 diabetes mellitus with diabetic nephropathy: Secondary | ICD-10-CM | POA: Diagnosis not present

## 2021-02-04 DIAGNOSIS — E7800 Pure hypercholesterolemia, unspecified: Secondary | ICD-10-CM | POA: Diagnosis not present

## 2021-02-04 DIAGNOSIS — M545 Low back pain, unspecified: Secondary | ICD-10-CM | POA: Diagnosis not present

## 2021-02-04 DIAGNOSIS — I129 Hypertensive chronic kidney disease with stage 1 through stage 4 chronic kidney disease, or unspecified chronic kidney disease: Secondary | ICD-10-CM | POA: Diagnosis not present

## 2021-02-04 DIAGNOSIS — Z0000 Encounter for general adult medical examination without abnormal findings: Secondary | ICD-10-CM | POA: Diagnosis not present

## 2021-02-04 DIAGNOSIS — K219 Gastro-esophageal reflux disease without esophagitis: Secondary | ICD-10-CM | POA: Diagnosis not present

## 2021-02-04 DIAGNOSIS — B354 Tinea corporis: Secondary | ICD-10-CM | POA: Diagnosis not present

## 2021-02-04 DIAGNOSIS — M109 Gout, unspecified: Secondary | ICD-10-CM | POA: Diagnosis not present

## 2021-03-04 ENCOUNTER — Other Ambulatory Visit: Payer: Self-pay | Admitting: Obstetrics and Gynecology

## 2021-03-04 DIAGNOSIS — Z1231 Encounter for screening mammogram for malignant neoplasm of breast: Secondary | ICD-10-CM

## 2021-03-30 DIAGNOSIS — S80261A Insect bite (nonvenomous), right knee, initial encounter: Secondary | ICD-10-CM | POA: Diagnosis not present

## 2021-03-30 DIAGNOSIS — W57XXXA Bitten or stung by nonvenomous insect and other nonvenomous arthropods, initial encounter: Secondary | ICD-10-CM | POA: Diagnosis not present

## 2021-05-06 ENCOUNTER — Ambulatory Visit: Payer: Medicare PPO

## 2021-05-31 ENCOUNTER — Other Ambulatory Visit: Payer: Self-pay

## 2021-05-31 ENCOUNTER — Ambulatory Visit
Admission: RE | Admit: 2021-05-31 | Discharge: 2021-05-31 | Disposition: A | Discharge status: Home / Self Care | Payer: Medicare PPO | Source: Ambulatory Visit | Attending: Obstetrics and Gynecology | Admitting: Obstetrics and Gynecology

## 2021-05-31 DIAGNOSIS — E119 Type 2 diabetes mellitus without complications: Secondary | ICD-10-CM | POA: Diagnosis not present

## 2021-05-31 DIAGNOSIS — H35033 Hypertensive retinopathy, bilateral: Secondary | ICD-10-CM | POA: Diagnosis not present

## 2021-05-31 DIAGNOSIS — H53142 Visual discomfort, left eye: Secondary | ICD-10-CM | POA: Diagnosis not present

## 2021-05-31 DIAGNOSIS — H40013 Open angle with borderline findings, low risk, bilateral: Secondary | ICD-10-CM | POA: Diagnosis not present

## 2021-05-31 DIAGNOSIS — Z961 Presence of intraocular lens: Secondary | ICD-10-CM | POA: Diagnosis not present

## 2021-05-31 DIAGNOSIS — Z1231 Encounter for screening mammogram for malignant neoplasm of breast: Secondary | ICD-10-CM

## 2021-06-09 DIAGNOSIS — Z808 Family history of malignant neoplasm of other organs or systems: Secondary | ICD-10-CM | POA: Diagnosis not present

## 2021-06-09 DIAGNOSIS — L304 Erythema intertrigo: Secondary | ICD-10-CM | POA: Diagnosis not present

## 2021-06-09 DIAGNOSIS — D225 Melanocytic nevi of trunk: Secondary | ICD-10-CM | POA: Diagnosis not present

## 2021-06-09 DIAGNOSIS — L249 Irritant contact dermatitis, unspecified cause: Secondary | ICD-10-CM | POA: Diagnosis not present

## 2021-06-09 DIAGNOSIS — L821 Other seborrheic keratosis: Secondary | ICD-10-CM | POA: Diagnosis not present

## 2021-06-09 DIAGNOSIS — L814 Other melanin hyperpigmentation: Secondary | ICD-10-CM | POA: Diagnosis not present

## 2021-06-09 DIAGNOSIS — D692 Other nonthrombocytopenic purpura: Secondary | ICD-10-CM | POA: Diagnosis not present

## 2021-06-09 DIAGNOSIS — L578 Other skin changes due to chronic exposure to nonionizing radiation: Secondary | ICD-10-CM | POA: Diagnosis not present

## 2021-08-12 DIAGNOSIS — Z853 Personal history of malignant neoplasm of breast: Secondary | ICD-10-CM | POA: Diagnosis not present

## 2021-08-12 DIAGNOSIS — R32 Unspecified urinary incontinence: Secondary | ICD-10-CM | POA: Diagnosis not present

## 2021-08-12 DIAGNOSIS — I129 Hypertensive chronic kidney disease with stage 1 through stage 4 chronic kidney disease, or unspecified chronic kidney disease: Secondary | ICD-10-CM | POA: Diagnosis not present

## 2021-08-12 DIAGNOSIS — E1121 Type 2 diabetes mellitus with diabetic nephropathy: Secondary | ICD-10-CM | POA: Diagnosis not present

## 2021-08-12 DIAGNOSIS — F39 Unspecified mood [affective] disorder: Secondary | ICD-10-CM | POA: Diagnosis not present

## 2021-08-12 DIAGNOSIS — N182 Chronic kidney disease, stage 2 (mild): Secondary | ICD-10-CM | POA: Diagnosis not present

## 2021-08-12 DIAGNOSIS — M109 Gout, unspecified: Secondary | ICD-10-CM | POA: Diagnosis not present

## 2021-08-12 DIAGNOSIS — E7800 Pure hypercholesterolemia, unspecified: Secondary | ICD-10-CM | POA: Diagnosis not present

## 2021-08-12 DIAGNOSIS — Z23 Encounter for immunization: Secondary | ICD-10-CM | POA: Diagnosis not present

## 2021-09-23 DIAGNOSIS — Z923 Personal history of irradiation: Secondary | ICD-10-CM | POA: Diagnosis not present

## 2021-09-23 DIAGNOSIS — L304 Erythema intertrigo: Secondary | ICD-10-CM | POA: Diagnosis not present

## 2021-09-23 DIAGNOSIS — N6489 Other specified disorders of breast: Secondary | ICD-10-CM | POA: Diagnosis not present

## 2021-09-23 DIAGNOSIS — Z853 Personal history of malignant neoplasm of breast: Secondary | ICD-10-CM | POA: Diagnosis not present

## 2022-01-21 ENCOUNTER — Encounter (HOSPITAL_BASED_OUTPATIENT_CLINIC_OR_DEPARTMENT_OTHER): Payer: Self-pay

## 2022-01-21 ENCOUNTER — Ambulatory Visit (HOSPITAL_BASED_OUTPATIENT_CLINIC_OR_DEPARTMENT_OTHER): Admit: 2022-01-21 | Payer: Medicare PPO | Admitting: Plastic Surgery

## 2022-01-21 SURGERY — MASTOPEXY
Anesthesia: General | Site: Breast | Laterality: Bilateral

## 2022-02-02 DIAGNOSIS — L304 Erythema intertrigo: Secondary | ICD-10-CM | POA: Diagnosis not present

## 2022-02-02 DIAGNOSIS — L219 Seborrheic dermatitis, unspecified: Secondary | ICD-10-CM | POA: Diagnosis not present

## 2022-02-02 DIAGNOSIS — L309 Dermatitis, unspecified: Secondary | ICD-10-CM | POA: Diagnosis not present

## 2022-03-04 ENCOUNTER — Other Ambulatory Visit: Payer: Self-pay | Admitting: Obstetrics and Gynecology

## 2022-03-04 DIAGNOSIS — Z1231 Encounter for screening mammogram for malignant neoplasm of breast: Secondary | ICD-10-CM

## 2022-03-14 DIAGNOSIS — M109 Gout, unspecified: Secondary | ICD-10-CM | POA: Diagnosis not present

## 2022-03-14 DIAGNOSIS — I129 Hypertensive chronic kidney disease with stage 1 through stage 4 chronic kidney disease, or unspecified chronic kidney disease: Secondary | ICD-10-CM | POA: Diagnosis not present

## 2022-03-14 DIAGNOSIS — E7800 Pure hypercholesterolemia, unspecified: Secondary | ICD-10-CM | POA: Diagnosis not present

## 2022-03-14 DIAGNOSIS — Z853 Personal history of malignant neoplasm of breast: Secondary | ICD-10-CM | POA: Diagnosis not present

## 2022-03-14 DIAGNOSIS — R0602 Shortness of breath: Secondary | ICD-10-CM | POA: Diagnosis not present

## 2022-03-14 DIAGNOSIS — M8588 Other specified disorders of bone density and structure, other site: Secondary | ICD-10-CM | POA: Diagnosis not present

## 2022-03-14 DIAGNOSIS — E1121 Type 2 diabetes mellitus with diabetic nephropathy: Secondary | ICD-10-CM | POA: Diagnosis not present

## 2022-03-14 DIAGNOSIS — Z0000 Encounter for general adult medical examination without abnormal findings: Secondary | ICD-10-CM | POA: Diagnosis not present

## 2022-03-14 DIAGNOSIS — J309 Allergic rhinitis, unspecified: Secondary | ICD-10-CM | POA: Diagnosis not present

## 2022-03-14 DIAGNOSIS — R32 Unspecified urinary incontinence: Secondary | ICD-10-CM | POA: Diagnosis not present

## 2022-03-15 DIAGNOSIS — R8290 Unspecified abnormal findings in urine: Secondary | ICD-10-CM | POA: Diagnosis not present

## 2022-03-24 IMAGING — MG DIGITAL SCREENING BILAT W/ TOMO W/ CAD
6 of 10 series · 6 of 30 positions shown · non-contrast
Comparison: Previous exam(s).

CLINICAL DATA: Screening. History of right lumpectomy in 4241.

EXAM:
DIGITAL SCREENING BILATERAL MAMMOGRAM WITH TOMO AND CAD

[R MLO synth-2D (1 of 2)]
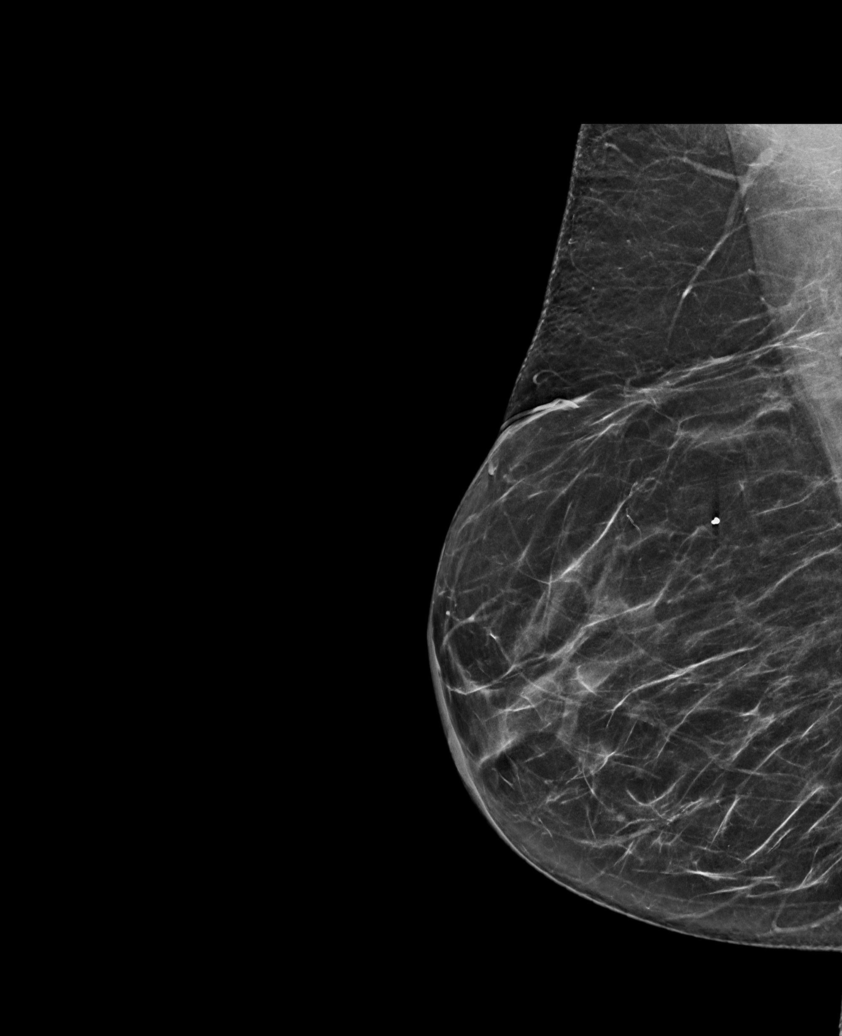

[R CC synth-2D]
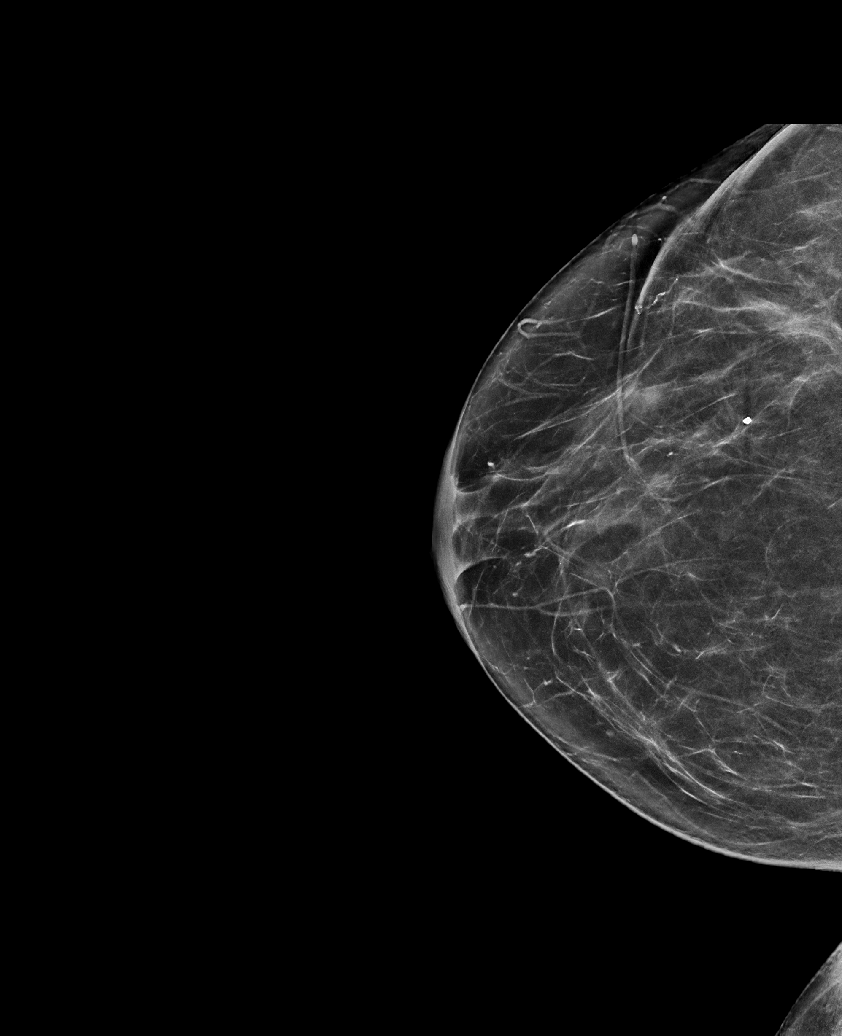

[R MLO synth-2D (2 of 2)]
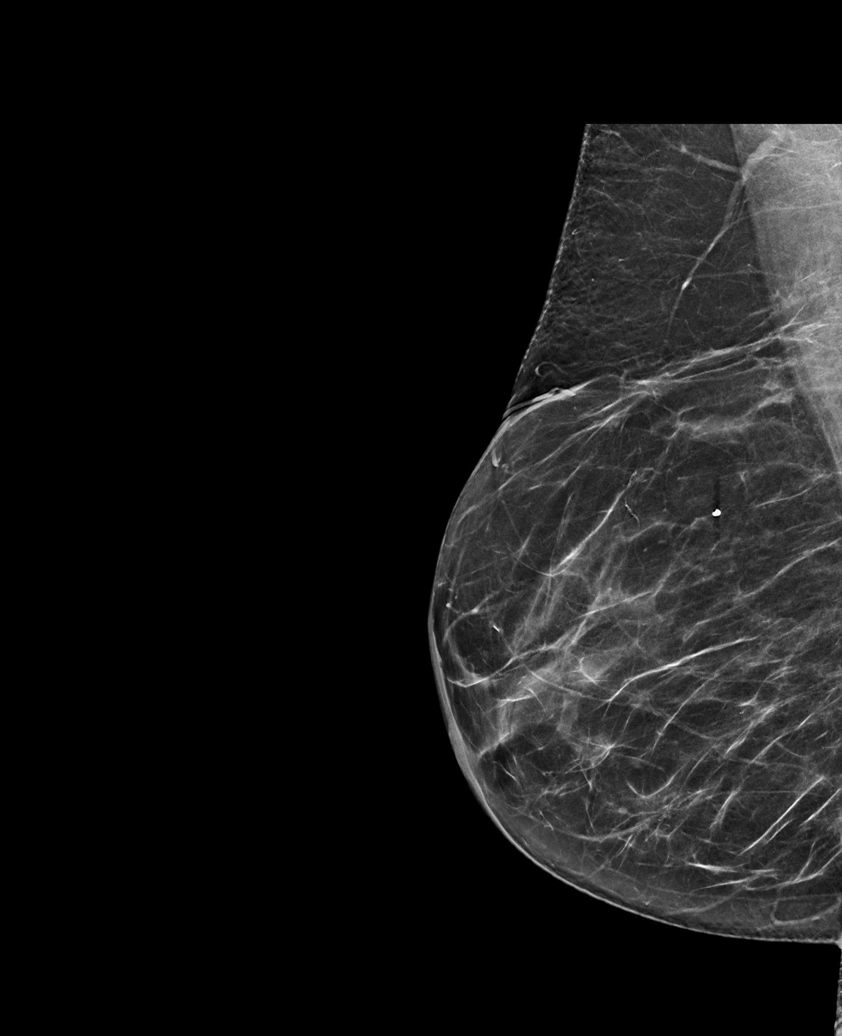

[L CC synth-2D]
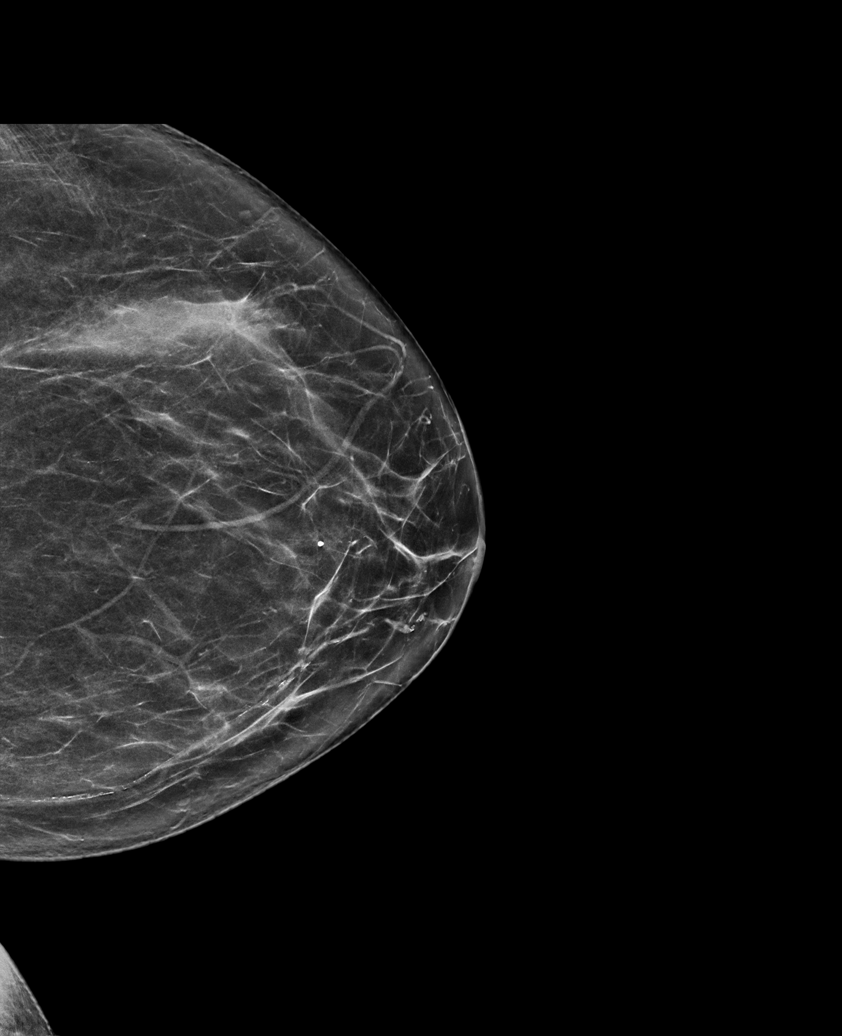

[L MLO synth-2D]
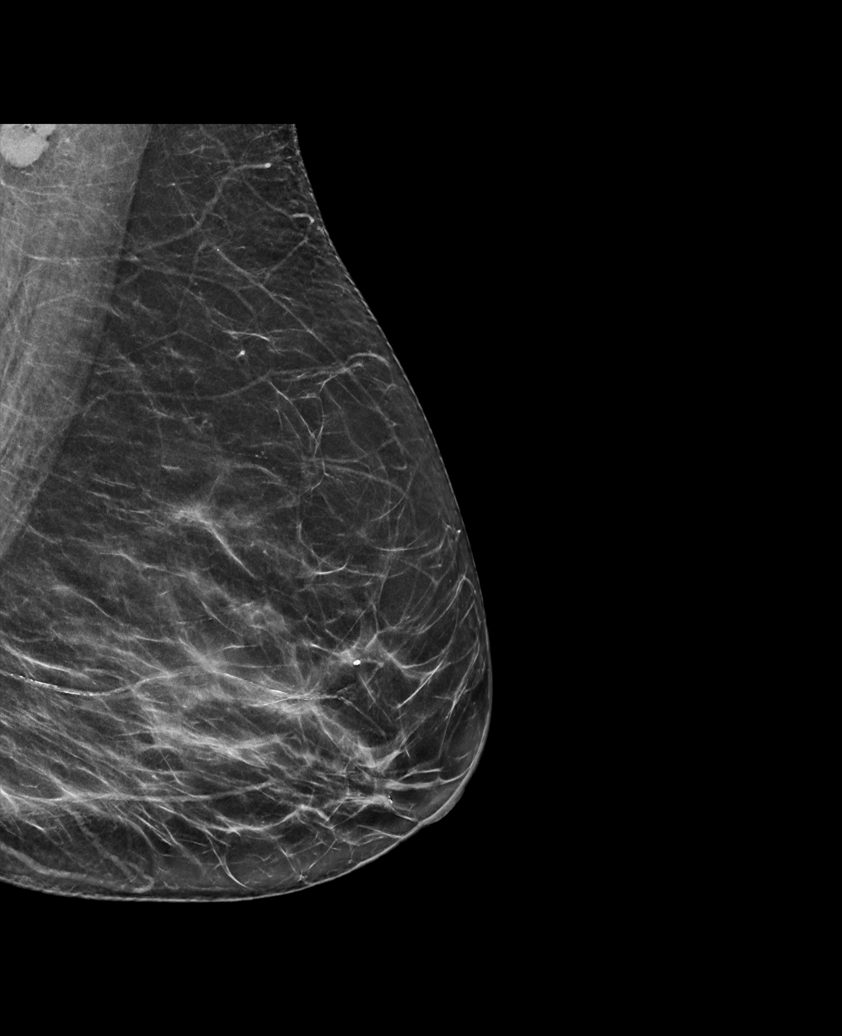

[R MLO tomo · tomo slice 34/67.0]
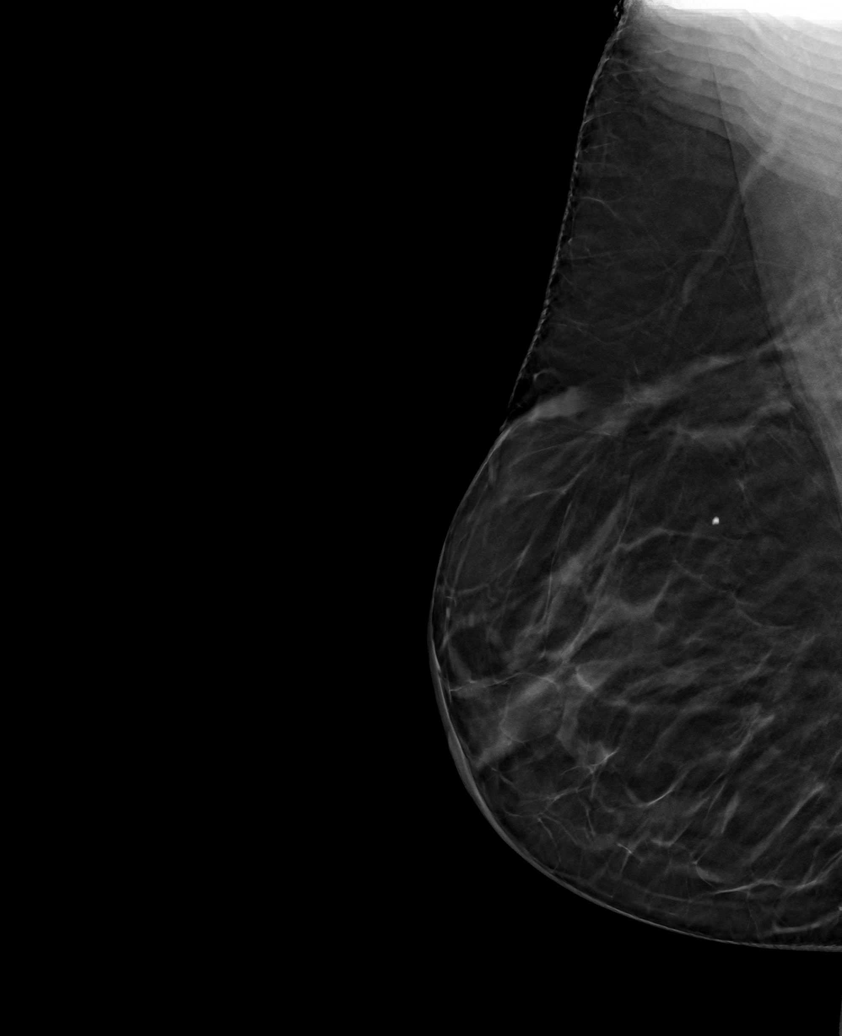

[6 of 30 positions shown; findings below may reference images not displayed]

ACR Breast Density Category b: There are scattered areas of
fibroglandular density.
FINDINGS: There are no findings suspicious for malignancy. Postsurgical
changes are seen in the right breast. Images were processed with
CAD.
IMPRESSION: No mammographic evidence of malignancy. A result letter of this
screening mammogram will be mailed directly to the patient.

RECOMMENDATION:
Screening mammogram in one year. (Code:[id number])

BI-RADS CATEGORY  1: Negative.

## 2022-03-29 DIAGNOSIS — Z6833 Body mass index (BMI) 33.0-33.9, adult: Secondary | ICD-10-CM | POA: Diagnosis not present

## 2022-03-29 DIAGNOSIS — Z124 Encounter for screening for malignant neoplasm of cervix: Secondary | ICD-10-CM | POA: Diagnosis not present

## 2022-03-29 DIAGNOSIS — Z8262 Family history of osteoporosis: Secondary | ICD-10-CM | POA: Diagnosis not present

## 2022-03-29 DIAGNOSIS — R300 Dysuria: Secondary | ICD-10-CM | POA: Diagnosis not present

## 2022-03-29 DIAGNOSIS — E119 Type 2 diabetes mellitus without complications: Secondary | ICD-10-CM | POA: Diagnosis not present

## 2022-03-29 DIAGNOSIS — N958 Other specified menopausal and perimenopausal disorders: Secondary | ICD-10-CM | POA: Diagnosis not present

## 2022-03-29 DIAGNOSIS — M8588 Other specified disorders of bone density and structure, other site: Secondary | ICD-10-CM | POA: Diagnosis not present

## 2022-03-30 DIAGNOSIS — Z1272 Encounter for screening for malignant neoplasm of vagina: Secondary | ICD-10-CM | POA: Diagnosis not present

## 2022-05-31 DIAGNOSIS — H25811 Combined forms of age-related cataract, right eye: Secondary | ICD-10-CM | POA: Diagnosis not present

## 2022-05-31 DIAGNOSIS — H353111 Nonexudative age-related macular degeneration, right eye, early dry stage: Secondary | ICD-10-CM | POA: Diagnosis not present

## 2022-05-31 DIAGNOSIS — H40013 Open angle with borderline findings, low risk, bilateral: Secondary | ICD-10-CM | POA: Diagnosis not present

## 2022-05-31 DIAGNOSIS — E119 Type 2 diabetes mellitus without complications: Secondary | ICD-10-CM | POA: Diagnosis not present

## 2022-06-02 ENCOUNTER — Ambulatory Visit
Admission: RE | Admit: 2022-06-02 | Discharge: 2022-06-02 | Disposition: A | Discharge status: Home / Self Care | Payer: Medicare PPO | Source: Ambulatory Visit | Attending: Obstetrics and Gynecology | Admitting: Obstetrics and Gynecology

## 2022-06-02 DIAGNOSIS — Z1231 Encounter for screening mammogram for malignant neoplasm of breast: Secondary | ICD-10-CM

## 2022-08-23 DIAGNOSIS — L249 Irritant contact dermatitis, unspecified cause: Secondary | ICD-10-CM | POA: Diagnosis not present

## 2022-08-23 DIAGNOSIS — Z808 Family history of malignant neoplasm of other organs or systems: Secondary | ICD-10-CM | POA: Diagnosis not present

## 2022-08-23 DIAGNOSIS — D225 Melanocytic nevi of trunk: Secondary | ICD-10-CM | POA: Diagnosis not present

## 2022-08-23 DIAGNOSIS — L821 Other seborrheic keratosis: Secondary | ICD-10-CM | POA: Diagnosis not present

## 2022-08-23 DIAGNOSIS — L814 Other melanin hyperpigmentation: Secondary | ICD-10-CM | POA: Diagnosis not present

## 2022-08-23 DIAGNOSIS — L820 Inflamed seborrheic keratosis: Secondary | ICD-10-CM | POA: Diagnosis not present

## 2022-08-23 DIAGNOSIS — L578 Other skin changes due to chronic exposure to nonionizing radiation: Secondary | ICD-10-CM | POA: Diagnosis not present

## 2022-09-14 DIAGNOSIS — E7800 Pure hypercholesterolemia, unspecified: Secondary | ICD-10-CM | POA: Diagnosis not present

## 2022-09-14 DIAGNOSIS — N644 Mastodynia: Secondary | ICD-10-CM | POA: Diagnosis not present

## 2022-09-14 DIAGNOSIS — I129 Hypertensive chronic kidney disease with stage 1 through stage 4 chronic kidney disease, or unspecified chronic kidney disease: Secondary | ICD-10-CM | POA: Diagnosis not present

## 2022-09-14 DIAGNOSIS — R42 Dizziness and giddiness: Secondary | ICD-10-CM | POA: Diagnosis not present

## 2022-09-14 DIAGNOSIS — L304 Erythema intertrigo: Secondary | ICD-10-CM | POA: Diagnosis not present

## 2022-09-14 DIAGNOSIS — G479 Sleep disorder, unspecified: Secondary | ICD-10-CM | POA: Diagnosis not present

## 2022-09-14 DIAGNOSIS — J309 Allergic rhinitis, unspecified: Secondary | ICD-10-CM | POA: Diagnosis not present

## 2022-09-14 DIAGNOSIS — E1121 Type 2 diabetes mellitus with diabetic nephropathy: Secondary | ICD-10-CM | POA: Diagnosis not present

## 2022-09-15 ENCOUNTER — Other Ambulatory Visit: Payer: Self-pay | Admitting: Internal Medicine

## 2022-09-15 DIAGNOSIS — N644 Mastodynia: Secondary | ICD-10-CM

## 2022-09-29 ENCOUNTER — Ambulatory Visit: Payer: Medicare PPO

## 2022-09-29 ENCOUNTER — Ambulatory Visit
Admission: RE | Admit: 2022-09-29 | Discharge: 2022-09-29 | Disposition: A | Discharge status: Home / Self Care | Payer: Medicare PPO | Source: Ambulatory Visit | Attending: Internal Medicine | Admitting: Internal Medicine

## 2022-09-29 DIAGNOSIS — N644 Mastodynia: Secondary | ICD-10-CM | POA: Diagnosis not present

## 2022-09-29 DIAGNOSIS — Z853 Personal history of malignant neoplasm of breast: Secondary | ICD-10-CM | POA: Diagnosis not present

## 2023-02-16 DIAGNOSIS — M109 Gout, unspecified: Secondary | ICD-10-CM | POA: Diagnosis not present

## 2023-03-22 DIAGNOSIS — M109 Gout, unspecified: Secondary | ICD-10-CM | POA: Diagnosis not present

## 2023-03-22 DIAGNOSIS — R32 Unspecified urinary incontinence: Secondary | ICD-10-CM | POA: Diagnosis not present

## 2023-03-22 DIAGNOSIS — E1121 Type 2 diabetes mellitus with diabetic nephropathy: Secondary | ICD-10-CM | POA: Diagnosis not present

## 2023-03-22 DIAGNOSIS — F39 Unspecified mood [affective] disorder: Secondary | ICD-10-CM | POA: Diagnosis not present

## 2023-03-22 DIAGNOSIS — E7800 Pure hypercholesterolemia, unspecified: Secondary | ICD-10-CM | POA: Diagnosis not present

## 2023-03-22 DIAGNOSIS — Z853 Personal history of malignant neoplasm of breast: Secondary | ICD-10-CM | POA: Diagnosis not present

## 2023-03-22 DIAGNOSIS — J309 Allergic rhinitis, unspecified: Secondary | ICD-10-CM | POA: Diagnosis not present

## 2023-03-22 DIAGNOSIS — I129 Hypertensive chronic kidney disease with stage 1 through stage 4 chronic kidney disease, or unspecified chronic kidney disease: Secondary | ICD-10-CM | POA: Diagnosis not present

## 2023-03-22 DIAGNOSIS — Z0000 Encounter for general adult medical examination without abnormal findings: Secondary | ICD-10-CM | POA: Diagnosis not present

## 2023-04-25 ENCOUNTER — Other Ambulatory Visit: Payer: Self-pay | Admitting: Internal Medicine

## 2023-04-25 DIAGNOSIS — Z1231 Encounter for screening mammogram for malignant neoplasm of breast: Secondary | ICD-10-CM

## 2023-05-30 DIAGNOSIS — N898 Other specified noninflammatory disorders of vagina: Secondary | ICD-10-CM | POA: Diagnosis not present

## 2023-05-30 DIAGNOSIS — M549 Dorsalgia, unspecified: Secondary | ICD-10-CM | POA: Diagnosis not present

## 2023-05-30 DIAGNOSIS — Z853 Personal history of malignant neoplasm of breast: Secondary | ICD-10-CM | POA: Diagnosis not present

## 2023-05-31 ENCOUNTER — Encounter: Payer: Self-pay | Admitting: Internal Medicine

## 2023-05-31 ENCOUNTER — Other Ambulatory Visit: Payer: Self-pay | Admitting: Internal Medicine

## 2023-05-31 DIAGNOSIS — M549 Dorsalgia, unspecified: Secondary | ICD-10-CM

## 2023-06-05 ENCOUNTER — Ambulatory Visit: Payer: Medicare PPO

## 2023-06-08 ENCOUNTER — Ambulatory Visit: Payer: Medicare PPO

## 2023-06-09 ENCOUNTER — Ambulatory Visit: Payer: Medicare PPO

## 2023-06-15 ENCOUNTER — Other Ambulatory Visit: Payer: Self-pay | Admitting: Internal Medicine

## 2023-06-15 DIAGNOSIS — M25551 Pain in right hip: Secondary | ICD-10-CM

## 2023-06-22 ENCOUNTER — Ambulatory Visit
Admission: RE | Admit: 2023-06-22 | Discharge: 2023-06-22 | Disposition: A | Discharge status: Home / Self Care | Payer: Medicare PPO | Source: Ambulatory Visit | Attending: Internal Medicine | Admitting: Internal Medicine

## 2023-06-22 DIAGNOSIS — M25551 Pain in right hip: Secondary | ICD-10-CM

## 2023-06-22 DIAGNOSIS — M546 Pain in thoracic spine: Secondary | ICD-10-CM | POA: Diagnosis not present

## 2023-06-22 DIAGNOSIS — M549 Dorsalgia, unspecified: Secondary | ICD-10-CM

## 2023-06-22 DIAGNOSIS — Z1231 Encounter for screening mammogram for malignant neoplasm of breast: Secondary | ICD-10-CM | POA: Diagnosis not present

## 2023-06-22 DIAGNOSIS — M4854XD Collapsed vertebra, not elsewhere classified, thoracic region, subsequent encounter for fracture with routine healing: Secondary | ICD-10-CM | POA: Diagnosis not present

## 2023-06-22 DIAGNOSIS — M47816 Spondylosis without myelopathy or radiculopathy, lumbar region: Secondary | ICD-10-CM | POA: Diagnosis not present

## 2023-06-22 DIAGNOSIS — M4854XA Collapsed vertebra, not elsewhere classified, thoracic region, initial encounter for fracture: Secondary | ICD-10-CM | POA: Diagnosis not present

## 2023-07-13 DIAGNOSIS — H40013 Open angle with borderline findings, low risk, bilateral: Secondary | ICD-10-CM | POA: Diagnosis not present

## 2023-07-13 DIAGNOSIS — H04123 Dry eye syndrome of bilateral lacrimal glands: Secondary | ICD-10-CM | POA: Diagnosis not present

## 2023-07-13 DIAGNOSIS — H25811 Combined forms of age-related cataract, right eye: Secondary | ICD-10-CM | POA: Diagnosis not present

## 2023-07-13 DIAGNOSIS — H353131 Nonexudative age-related macular degeneration, bilateral, early dry stage: Secondary | ICD-10-CM | POA: Diagnosis not present

## 2023-07-31 DIAGNOSIS — L814 Other melanin hyperpigmentation: Secondary | ICD-10-CM | POA: Diagnosis not present

## 2023-07-31 DIAGNOSIS — D225 Melanocytic nevi of trunk: Secondary | ICD-10-CM | POA: Diagnosis not present

## 2023-07-31 DIAGNOSIS — L249 Irritant contact dermatitis, unspecified cause: Secondary | ICD-10-CM | POA: Diagnosis not present

## 2023-07-31 DIAGNOSIS — L820 Inflamed seborrheic keratosis: Secondary | ICD-10-CM | POA: Diagnosis not present

## 2023-07-31 DIAGNOSIS — L578 Other skin changes due to chronic exposure to nonionizing radiation: Secondary | ICD-10-CM | POA: Diagnosis not present

## 2023-07-31 DIAGNOSIS — Z808 Family history of malignant neoplasm of other organs or systems: Secondary | ICD-10-CM | POA: Diagnosis not present

## 2023-07-31 DIAGNOSIS — L821 Other seborrheic keratosis: Secondary | ICD-10-CM | POA: Diagnosis not present

## 2023-08-09 DIAGNOSIS — Z6826 Body mass index (BMI) 26.0-26.9, adult: Secondary | ICD-10-CM | POA: Diagnosis not present

## 2023-08-09 DIAGNOSIS — S22080A Wedge compression fracture of T11-T12 vertebra, initial encounter for closed fracture: Secondary | ICD-10-CM | POA: Diagnosis not present

## 2023-09-21 DIAGNOSIS — E1121 Type 2 diabetes mellitus with diabetic nephropathy: Secondary | ICD-10-CM | POA: Diagnosis not present

## 2023-09-21 DIAGNOSIS — R197 Diarrhea, unspecified: Secondary | ICD-10-CM | POA: Diagnosis not present

## 2023-09-21 DIAGNOSIS — S22080S Wedge compression fracture of T11-T12 vertebra, sequela: Secondary | ICD-10-CM | POA: Diagnosis not present

## 2023-09-21 DIAGNOSIS — R011 Cardiac murmur, unspecified: Secondary | ICD-10-CM | POA: Diagnosis not present

## 2023-09-21 DIAGNOSIS — I129 Hypertensive chronic kidney disease with stage 1 through stage 4 chronic kidney disease, or unspecified chronic kidney disease: Secondary | ICD-10-CM | POA: Diagnosis not present

## 2023-09-21 DIAGNOSIS — E7800 Pure hypercholesterolemia, unspecified: Secondary | ICD-10-CM | POA: Diagnosis not present

## 2023-09-21 DIAGNOSIS — R32 Unspecified urinary incontinence: Secondary | ICD-10-CM | POA: Diagnosis not present

## 2023-09-21 DIAGNOSIS — F39 Unspecified mood [affective] disorder: Secondary | ICD-10-CM | POA: Diagnosis not present

## 2023-09-21 DIAGNOSIS — G479 Sleep disorder, unspecified: Secondary | ICD-10-CM | POA: Diagnosis not present

## 2023-10-18 DIAGNOSIS — K4130 Unilateral femoral hernia, with obstruction, without gangrene, not specified as recurrent: Secondary | ICD-10-CM | POA: Diagnosis not present

## 2023-10-18 DIAGNOSIS — E1121 Type 2 diabetes mellitus with diabetic nephropathy: Secondary | ICD-10-CM | POA: Diagnosis not present

## 2023-11-02 DIAGNOSIS — K4090 Unilateral inguinal hernia, without obstruction or gangrene, not specified as recurrent: Secondary | ICD-10-CM | POA: Diagnosis not present

## 2024-04-04 DIAGNOSIS — N760 Acute vaginitis: Secondary | ICD-10-CM | POA: Diagnosis not present

## 2024-04-04 DIAGNOSIS — Z1272 Encounter for screening for malignant neoplasm of vagina: Secondary | ICD-10-CM | POA: Diagnosis not present

## 2024-04-04 DIAGNOSIS — K4190 Unilateral femoral hernia, without obstruction or gangrene, not specified as recurrent: Secondary | ICD-10-CM | POA: Diagnosis not present

## 2024-04-04 DIAGNOSIS — Z779 Other contact with and (suspected) exposures hazardous to health: Secondary | ICD-10-CM | POA: Diagnosis not present

## 2024-04-04 DIAGNOSIS — Z853 Personal history of malignant neoplasm of breast: Secondary | ICD-10-CM | POA: Diagnosis not present

## 2024-04-04 DIAGNOSIS — Z6827 Body mass index (BMI) 27.0-27.9, adult: Secondary | ICD-10-CM | POA: Diagnosis not present

## 2024-04-04 DIAGNOSIS — R21 Rash and other nonspecific skin eruption: Secondary | ICD-10-CM | POA: Diagnosis not present

## 2024-04-26 DIAGNOSIS — M109 Gout, unspecified: Secondary | ICD-10-CM | POA: Diagnosis not present

## 2024-04-26 DIAGNOSIS — E7800 Pure hypercholesterolemia, unspecified: Secondary | ICD-10-CM | POA: Diagnosis not present

## 2024-04-26 DIAGNOSIS — E1121 Type 2 diabetes mellitus with diabetic nephropathy: Secondary | ICD-10-CM | POA: Diagnosis not present

## 2024-04-26 DIAGNOSIS — Z0000 Encounter for general adult medical examination without abnormal findings: Secondary | ICD-10-CM | POA: Diagnosis not present

## 2024-04-26 DIAGNOSIS — R32 Unspecified urinary incontinence: Secondary | ICD-10-CM | POA: Diagnosis not present

## 2024-04-26 DIAGNOSIS — S22080S Wedge compression fracture of T11-T12 vertebra, sequela: Secondary | ICD-10-CM | POA: Diagnosis not present

## 2024-04-26 DIAGNOSIS — I129 Hypertensive chronic kidney disease with stage 1 through stage 4 chronic kidney disease, or unspecified chronic kidney disease: Secondary | ICD-10-CM | POA: Diagnosis not present

## 2024-04-26 DIAGNOSIS — F39 Unspecified mood [affective] disorder: Secondary | ICD-10-CM | POA: Diagnosis not present

## 2024-04-26 DIAGNOSIS — J309 Allergic rhinitis, unspecified: Secondary | ICD-10-CM | POA: Diagnosis not present

## 2024-04-29 ENCOUNTER — Other Ambulatory Visit: Payer: Self-pay | Admitting: Internal Medicine

## 2024-04-29 DIAGNOSIS — Z1231 Encounter for screening mammogram for malignant neoplasm of breast: Secondary | ICD-10-CM

## 2024-05-23 DIAGNOSIS — R0683 Snoring: Secondary | ICD-10-CM | POA: Diagnosis not present

## 2024-05-23 DIAGNOSIS — F5104 Psychophysiologic insomnia: Secondary | ICD-10-CM | POA: Diagnosis not present

## 2024-06-26 DIAGNOSIS — G4733 Obstructive sleep apnea (adult) (pediatric): Secondary | ICD-10-CM | POA: Diagnosis not present

## 2024-06-27 ENCOUNTER — Ambulatory Visit

## 2024-06-27 ENCOUNTER — Ambulatory Visit
Admission: RE | Admit: 2024-06-27 | Discharge: 2024-06-27 | Disposition: A | Discharge status: Home / Self Care | Source: Ambulatory Visit | Attending: Internal Medicine

## 2024-06-27 DIAGNOSIS — Z1231 Encounter for screening mammogram for malignant neoplasm of breast: Secondary | ICD-10-CM | POA: Diagnosis not present

## 2024-07-22 ENCOUNTER — Other Ambulatory Visit (HOSPITAL_COMMUNITY): Payer: Self-pay | Admitting: Internal Medicine

## 2024-07-22 ENCOUNTER — Encounter (HOSPITAL_COMMUNITY): Payer: Self-pay | Admitting: Internal Medicine

## 2024-07-22 DIAGNOSIS — R011 Cardiac murmur, unspecified: Secondary | ICD-10-CM

## 2024-07-22 DIAGNOSIS — R590 Localized enlarged lymph nodes: Secondary | ICD-10-CM

## 2024-07-22 DIAGNOSIS — E1121 Type 2 diabetes mellitus with diabetic nephropathy: Secondary | ICD-10-CM | POA: Diagnosis not present

## 2024-07-22 DIAGNOSIS — R42 Dizziness and giddiness: Secondary | ICD-10-CM | POA: Diagnosis not present

## 2024-07-22 DIAGNOSIS — R2689 Other abnormalities of gait and mobility: Secondary | ICD-10-CM | POA: Diagnosis not present

## 2024-07-22 DIAGNOSIS — Z9181 History of falling: Secondary | ICD-10-CM | POA: Diagnosis not present

## 2024-07-23 DIAGNOSIS — G4733 Obstructive sleep apnea (adult) (pediatric): Secondary | ICD-10-CM | POA: Diagnosis not present

## 2024-07-25 ENCOUNTER — Ambulatory Visit (HOSPITAL_COMMUNITY)
Admission: RE | Admit: 2024-07-25 | Discharge: 2024-07-25 | Disposition: A | Discharge status: Home / Self Care | Source: Ambulatory Visit | Attending: Internal Medicine | Admitting: Internal Medicine

## 2024-07-25 DIAGNOSIS — Z0389 Encounter for observation for other suspected diseases and conditions ruled out: Secondary | ICD-10-CM | POA: Diagnosis not present

## 2024-07-25 DIAGNOSIS — R590 Localized enlarged lymph nodes: Secondary | ICD-10-CM | POA: Insufficient documentation

## 2024-08-07 ENCOUNTER — Ambulatory Visit (HOSPITAL_COMMUNITY)
Admission: RE | Admit: 2024-08-07 | Discharge: 2024-08-07 | Disposition: A | Discharge status: Home / Self Care | Source: Ambulatory Visit | Attending: Internal Medicine | Admitting: Internal Medicine

## 2024-08-07 DIAGNOSIS — H353131 Nonexudative age-related macular degeneration, bilateral, early dry stage: Secondary | ICD-10-CM | POA: Diagnosis not present

## 2024-08-07 DIAGNOSIS — I082 Rheumatic disorders of both aortic and tricuspid valves: Secondary | ICD-10-CM | POA: Diagnosis not present

## 2024-08-07 DIAGNOSIS — E119 Type 2 diabetes mellitus without complications: Secondary | ICD-10-CM | POA: Diagnosis not present

## 2024-08-07 DIAGNOSIS — R011 Cardiac murmur, unspecified: Secondary | ICD-10-CM | POA: Diagnosis not present

## 2024-08-07 DIAGNOSIS — H25811 Combined forms of age-related cataract, right eye: Secondary | ICD-10-CM | POA: Diagnosis not present

## 2024-08-07 DIAGNOSIS — I77810 Thoracic aortic ectasia: Secondary | ICD-10-CM | POA: Diagnosis not present

## 2024-08-07 DIAGNOSIS — H524 Presbyopia: Secondary | ICD-10-CM | POA: Diagnosis not present

## 2024-08-07 DIAGNOSIS — I371 Nonrheumatic pulmonary valve insufficiency: Secondary | ICD-10-CM | POA: Diagnosis not present

## 2024-08-07 DIAGNOSIS — H40013 Open angle with borderline findings, low risk, bilateral: Secondary | ICD-10-CM | POA: Diagnosis not present

## 2024-08-07 LAB — ECHOCARDIOGRAM COMPLETE
AR max vel: 2.14 cm2
AV Area VTI: 2.56 cm2
AV Area mean vel: 2.59 cm2
AV Mean grad: 6 mmHg
AV Peak grad: 12.7 mmHg
Ao pk vel: 1.78 m/s
Area-P 1/2: 2.93 cm2
S' Lateral: 2.6 cm

## 2024-08-09 DIAGNOSIS — R42 Dizziness and giddiness: Secondary | ICD-10-CM | POA: Diagnosis not present

## 2024-08-21 DIAGNOSIS — L249 Irritant contact dermatitis, unspecified cause: Secondary | ICD-10-CM | POA: Diagnosis not present

## 2024-08-21 DIAGNOSIS — L820 Inflamed seborrheic keratosis: Secondary | ICD-10-CM | POA: Diagnosis not present

## 2024-08-21 DIAGNOSIS — L570 Actinic keratosis: Secondary | ICD-10-CM | POA: Diagnosis not present

## 2024-08-21 DIAGNOSIS — L578 Other skin changes due to chronic exposure to nonionizing radiation: Secondary | ICD-10-CM | POA: Diagnosis not present

## 2024-08-21 DIAGNOSIS — L814 Other melanin hyperpigmentation: Secondary | ICD-10-CM | POA: Diagnosis not present

## 2024-08-21 DIAGNOSIS — L821 Other seborrheic keratosis: Secondary | ICD-10-CM | POA: Diagnosis not present

## 2024-08-21 DIAGNOSIS — D225 Melanocytic nevi of trunk: Secondary | ICD-10-CM | POA: Diagnosis not present

## 2024-08-21 DIAGNOSIS — Z808 Family history of malignant neoplasm of other organs or systems: Secondary | ICD-10-CM | POA: Diagnosis not present

## 2024-08-28 ENCOUNTER — Encounter: Payer: Self-pay | Admitting: Cardiovascular Disease

## 2024-08-28 ENCOUNTER — Ambulatory Visit: Attending: Cardiovascular Disease | Admitting: Cardiovascular Disease

## 2024-08-28 VITALS — BP 139/83 | HR 78 | Ht 61.0 in | Wt 151.2 lb

## 2024-08-28 DIAGNOSIS — R011 Cardiac murmur, unspecified: Secondary | ICD-10-CM

## 2024-08-28 DIAGNOSIS — I4719 Other supraventricular tachycardia: Secondary | ICD-10-CM

## 2024-08-28 DIAGNOSIS — I7121 Aneurysm of the ascending aorta, without rupture: Secondary | ICD-10-CM

## 2024-08-28 DIAGNOSIS — I493 Ventricular premature depolarization: Secondary | ICD-10-CM | POA: Diagnosis not present

## 2024-08-28 DIAGNOSIS — I351 Nonrheumatic aortic (valve) insufficiency: Secondary | ICD-10-CM | POA: Diagnosis not present

## 2024-08-28 NOTE — Patient Instructions (Signed)
 Medication Instructions:  No changes *If you need a refill on your cardiac medications before your next appointment, please call your pharmacy*  Lab Work: None ordered If you have labs (blood work) drawn today and your tests are completely normal, you will receive your results only by: MyChart Message (if you have MyChart) OR A paper copy in the mail If you have any lab test that is abnormal or we need to change your treatment, we will call you to review the results.  Testing/Procedures: CT- Aorta- Non-Cardiac CT Angiography (CTA), is a special type of CT scan that uses a computer to produce multi-dimensional views of major blood vessels throughout the body. In CT angiography, a contrast material is injected through an IV to help visualize the blood vessels   Your physician has requested that you have an echocardiogram in one year. Echocardiography is a painless test that uses sound waves to create images of your heart. It provides your doctor with information about the size and shape of your heart and how well your heart's chambers and valves are working. This procedure takes approximately one hour. There are no restrictions for this procedure. Please do NOT wear cologne, perfume, aftershave, or lotions (deodorant is allowed). Please arrive 15 minutes prior to your appointment time.  Please note: We ask at that you not bring children with you during ultrasound (echo/ vascular) testing. Due to room size and safety concerns, children are not allowed in the ultrasound rooms during exams. Our front office staff cannot provide observation of children in our lobby area while testing is being conducted. An adult accompanying a patient to their appointment will only be allowed in the ultrasound room at the discretion of the ultrasound technician under special circumstances. We apologize for any inconvenience.   Follow-Up: At [facility], you and your health needs are our priority.  As part of  our continuing mission to provide you with exceptional heart care, our providers are all part of one team.  This team includes your primary Cardiologist (physician) and Advanced Practice Providers or APPs (Physician Assistants and Nurse Practitioners) who all work together to provide you with the care you need, when you need it.  Your next appointment:   1 year(s) (please schedule after Echo)  Provider:   Dr Francyne  We recommend signing up for the patient portal called MyChart.  Sign up information is provided on this After Visit Summary.  MyChart is used to connect with patients for Virtual Visits (Telemedicine).  Patients are able to view lab/test results, encounter notes, upcoming appointments, etc.  Non-urgent messages can be sent to your provider as well.   To learn more about what you can do with MyChart, go to [email or web address].

## 2024-08-28 NOTE — Progress Notes (Signed)
 Cardiology Office Note:    Date:  08/31/2024   ID:  JOSEPH JOHNS, DOB Nov 29, 1940, MRN [id number]  PCP:  Joy Velna SAUNDERS, MD   [facility] Providers Cardiologist:  None     Referring MD: Joy Velna SAUNDERS, MD   Chief Complaint  Patient presents with   Consult  Joy Owens is a 83 y.o. female who is being seen today for the evaluation of aortic insufficiency at the request of Pahwani, Velna SAUNDERS, MD.   History of Present Illness:    Joy Owens is a 83 y.o. female with a hx of who presents for evaluation following a fall and subsequent echocardiogram findings.  She experienced a fall at the end of September after bending down slightly, resulting in two knots on her head. She has a history of dizziness upon bending down and standing up, but notes that this incident felt different. She did not lose consciousness during the fall. A neighbor assisted her, checking her blood sugar and blood pressure, which was slightly elevated at the time.  Following the fall, she wore a heart monitor for two weeks, which recorded brief episodes of ectopic atrial tachycardia and premature ventricular contractions (PVCs). She did not experience any symptoms during these episodes and did not press the monitor's button to record any events. Since the fall, she feels 'off balance' and 'in a little stupid category'.  She underwent an echocardiogram, which demonstrated moderate aortic insufficiency and a mildly dilated ascending aorta. She recalls a previous echocardiogram years ago but does not remember being informed of a leaky valve at that time. She reports no shortness of breath or other symptoms.  Her current medication includes telmisartan for blood pressure management. She mentions a family history of heart issues, with her grandfather having had heart attacks, but no known family history of aneurysms or sudden cardiac death. She lives alone.  Past Medical History:  Diagnosis Date    Abrasion of finger 01/24/2012   Anxiety    situational   Arthritis    hand   Cancer (HCC) 01/2012   right breast   Cataract immature    Complication of anesthesia 1973   prolonged emergence and no memory of events x 5 days   Diabetes mellitus    diet-controlled   Hypertension    under control, has been on med. x 10 yrs.   Osteopenia    Overactive bladder    Personal history of radiation therapy    Seasonal allergies     Past Surgical History:  Procedure Laterality Date   BREAST BIOPSY  12/30/2011   BREAST CYST ASPIRATION  04/06/2012   BREAST LUMPECTOMY Left 01/30/2012   BREAST MAMMOSITE  01/30/2012   Procedure: MAMMOSITE BREAST;  Surgeon: Sherlean JINNY Laughter, MD;  Location: [facility];  Service: General;  Laterality: Right;   mammosite placement   BREAST SURGERY     CATARACT EXTRACTION     x 1   COLONOSCOPY  2003, 2008, 10/2011   DILATION AND CURETTAGE OF UTERUS  01/31/2001   with hysteroscopy   LAPAROSCOPIC ASSISTED VAGINAL HYSTERECTOMY  12/01/2009   with BSO, posterior repair   POLYPECTOMY  2003    with colonoscopy   TONSILLECTOMY  1953   TUBAL LIGATION  1973    Current Medications: Current Meds  Medication Sig   allopurinol (ZYLOPRIM) 300 MG tablet Take 1 tablet by mouth as needed.   anastrozole  (ARIMIDEX ) 1 MG tablet Take 1 tablet (1 mg total)  by mouth daily.   aspirin 81 MG chewable tablet Chew 81 mg by mouth daily.   Biotin 10 MG TABS Take 10 mg by mouth daily at 6 (six) AM.   MOUNJARO 7.5 MG/0.5ML Pen Inject 7.5 mg into the skin once a week.   Multiple Vitamins-Minerals (PRESERVISION AREDS 2) CAPS Take 1 capsule by mouth daily at 6 (six) AM.   Phosphatidylserine-DHA-EPA (VAYACOG PO) Take 1 tablet by mouth daily.   telmisartan (MICARDIS) 40 MG tablet Take 40 mg by mouth daily.   temazepam (RESTORIL) 30 MG capsule Take 30 mg by mouth at bedtime. (Patient taking differently: Take 30 mg by mouth as needed.)   tolterodine (DETROL LA) 4 MG 24 hr capsule  Take 4 mg by mouth daily.   Current Facility-Administered Medications for the 08/28/24 encounter (Office Visit) with Alf Doyle, MD  Medication   0.9 %  sodium chloride  infusion     Allergies:   Epinephrine and Sudafed [pseudoephedrine hcl]   Social History   Socioeconomic History   Marital status: Divorced    Spouse name: Not on file   Number of children: Not on file   Years of education: Not on file   Highest education level: Not on file  Occupational History   Occupation: retired    Associate Professor: RETIRED  Tobacco Use   Smoking status: Former    Current packs/day: 0.00    Types: Cigarettes    Start date: 10/10/1973    Quit date: 10/10/1988    Years since quitting: 35.9   Smokeless tobacco: Never  Substance and Sexual Activity   Alcohol use: Yes    Comment: 1 glass wine daily   Drug use: No   Sexual activity: Not on file  Other Topics Concern   Not on file  Social History Narrative   Retired from the [employer]    Divorced   Social Drivers of Corporate Investment Banker Strain: Not on file  Food Insecurity: Not on file  Transportation Needs: Not on file  Physical Activity: Not on file  Stress: Not on file  Social Connections: Not on file     Family History: The patient's family history includes Breast cancer in her maternal grandmother; Cancer in her mother; Hypertension in her paternal grandfather.  ROS:   Please see the history of present illness.     All other systems reviewed and are negative.  EKGs/Labs/Other Studies Reviewed:    The following studies were reviewed today: Echocardiogram 08/07/2024  1. Left ventricular ejection fraction, by estimation, is 60 to 65%. The  left ventricle has normal function. The left ventricle has no regional  wall motion abnormalities. Left ventricular diastolic parameters are  consistent with Grade I diastolic  dysfunction (impaired relaxation). The average left ventricular global  longitudinal strain  is -19.3 %. The global longitudinal strain is normal.   2. Right ventricular systolic function is normal. The right ventricular  size is normal.   3. The mitral valve is normal in structure. No evidence of mitral valve  regurgitation. No evidence of mitral stenosis.   4. The aortic valve is tricuspid. Aortic valve regurgitation is moderate.  No aortic stenosis is present.   5. There is mild dilatation of the ascending aorta, measuring 43 mm.   6. The inferior vena cava is normal in size with greater than 50%  respiratory variability, suggesting right atrial pressure of 3 mmHg.    EKG Interpretation Date/Time:  Wednesday August 28 2024 16:05:54  EST Ventricular Rate:  78 PR Interval:  158 QRS Duration:  84 QT Interval:  380 QTC Calculation: 433 R Axis:   -16  Text Interpretation: Normal sinus rhythm Possible Anterior infarct , age undetermined When compared with ECG of 24-Jan-2012 15:15, No significant change was found Confirmed by Lansing Sigmon [phone]) on 08/28/2024 4:30:15 PM    Recent Labs: No results found for requested labs within last 365 days.  Recent Lipid Panel No results found for: CHOL, TRIG, HDL, CHOLHDL, VLDL, LDLCALC, LDLDIRECT 04/26/2024 Cholesterol 160, HDL 50, LDL 95, triglycerides 80 Hemoglobin A1c 5.3% Potassium 4.2  Risk Assessment/Calculations:                Physical Exam:    VS:  BP 139/83   Pulse 78   Ht 5' 1 (1.549 m)   Wt 151 lb 3.2 oz (68.6 kg)   SpO2 94%   BMI 28.57 kg/m     Wt Readings from Last 3 Encounters:  08/28/24 151 lb 3.2 oz (68.6 kg)  02/28/17 176 lb 3.2 oz (79.9 kg)  11/29/16 174 lb (78.9 kg)     GEN: Mildly overweight, well nourished, well developed in no acute distress HEENT: Normal NECK: No JVD; No carotid bruits LYMPHATICS: No lymphadenopathy CARDIAC: RRR, 1/6 aortic ejection murmur, no diastolic murmurs, rubs, gallops; the second heart sound is normal.  I cannot hear the diastolic murmur of aortic  insufficiency. RESPIRATORY:  Clear to auscultation without rales, wheezing or rhonchi  ABDOMEN: Soft, non-tender, non-distended MUSCULOSKELETAL:  No edema; No deformity  SKIN: Warm and dry NEUROLOGIC:  Alert and oriented x 3 PSYCHIATRIC:  Normal affect   ASSESSMENT:    1. Aneurysm of ascending aorta without rupture   2. Nonrheumatic aortic valve insufficiency   3. Atrial tachycardia   4. PVC's (premature ventricular contractions)    PLAN:    In order of problems listed above:  Moderate aortic valve regurgitation with mild ascending aortic aneurysm:  Moderate aortic valve regurgitation with a mildly dilated ascending aortic aneurysm measuring 4.3 cm. The condition is chronic and well-compensated. No symptoms of shortness of breath or significant heart failure. The aneurysm is not currently at a size requiring surgical intervention, which is typically considered at 5.5 cm due to the risk of rupture. Telmisartan is an appropriate choice for blood pressure management as it may help prevent further aortic dilation.  Recheck the size of the aorta with a CT angiogram, which will provide a more accurate measurement.  Recheck an echocardiogram in 1 year to reevaluate LV size and function and the severity of aortic insufficiency.  Will meet up in the clinic after the echo is performed.  In the meantime, she is advised to report any new symptoms such as shortness of breath or changes in exercise tolerance. Paroxysmal supraventricular tachycardia and premature ventricular contractions (benign, brief episodes recorded on arrhythmia monitor).  These episodes are not clinically significant and do not pose a risk of harm. They are not associated with the recent fall or dizziness.  There was no evidence of atrial fibrillation or ventricular tachycardia and no specific treatment is necessary. HTN: Well-controlled.          Medication Adjustments/Labs and Tests Ordered: Current medicines are reviewed at  length with the patient today.  Concerns regarding medicines are outlined above.  Orders Placed This Encounter  Procedures   CT ANGIO CHEST AORTA W/CM & OR WO/CM   EKG 12-Lead   ECHOCARDIOGRAM COMPLETE   No orders of the  defined types were placed in this encounter.   Patient Instructions  Medication Instructions:  No changes *If you need a refill on your cardiac medications before your next appointment, please call your pharmacy*  Lab Work: None ordered If you have labs (blood work) drawn today and your tests are completely normal, you will receive your results only by: MyChart Message (if you have MyChart) OR A paper copy in the mail If you have any lab test that is abnormal or we need to change your treatment, we will call you to review the results.  Testing/Procedures: CT- Aorta- Non-Cardiac CT Angiography (CTA), is a special type of CT scan that uses a computer to produce multi-dimensional views of major blood vessels throughout the body. In CT angiography, a contrast material is injected through an IV to help visualize the blood vessels   Your physician has requested that you have an echocardiogram in one year. Echocardiography is a painless test that uses sound waves to create images of your heart. It provides your doctor with information about the size and shape of your heart and how well your heart's chambers and valves are working. This procedure takes approximately one hour. There are no restrictions for this procedure. Please do NOT wear cologne, perfume, aftershave, or lotions (deodorant is allowed). Please arrive 15 minutes prior to your appointment time.  Please note: We ask at that you not bring children with you during ultrasound (echo/ vascular) testing. Due to room size and safety concerns, children are not allowed in the ultrasound rooms during exams. Our front office staff cannot provide observation of children in our lobby area while testing is being conducted. An  adult accompanying a patient to their appointment will only be allowed in the ultrasound room at the discretion of the ultrasound technician under special circumstances. We apologize for any inconvenience.   Follow-Up: At [facility], you and your health needs are our priority.  As part of our continuing mission to provide you with exceptional heart care, our providers are all part of one team.  This team includes your primary Cardiologist (physician) and Advanced Practice Providers or APPs (Physician Assistants and Nurse Practitioners) who all work together to provide you with the care you need, when you need it.  Your next appointment:   1 year(s) (please schedule after Echo)  Provider:   Dr Francyne  We recommend signing up for the patient portal called MyChart.  Sign up information is provided on this After Visit Summary.  MyChart is used to connect with patients for Virtual Visits (Telemedicine).  Patients are able to view lab/test results, encounter notes, upcoming appointments, etc.  Non-urgent messages can be sent to your provider as well.   To learn more about what you can do with MyChart, go to [email or web address].      Signed, Jerel Francyne, MD  08/31/2024 4:47 PM    [facility]

## 2024-08-31 ENCOUNTER — Encounter: Payer: Self-pay | Admitting: Cardiovascular Disease

## 2024-08-31 DIAGNOSIS — I351 Nonrheumatic aortic (valve) insufficiency: Secondary | ICD-10-CM | POA: Insufficient documentation

## 2024-08-31 DIAGNOSIS — I7121 Aneurysm of the ascending aorta, without rupture: Secondary | ICD-10-CM | POA: Insufficient documentation

## 2024-08-31 DIAGNOSIS — I4719 Other supraventricular tachycardia: Secondary | ICD-10-CM | POA: Insufficient documentation

## 2024-08-31 DIAGNOSIS — I493 Ventricular premature depolarization: Secondary | ICD-10-CM | POA: Insufficient documentation

## 2024-09-12 ENCOUNTER — Ambulatory Visit (HOSPITAL_COMMUNITY)
Admission: RE | Admit: 2024-09-12 | Discharge: 2024-09-12 | Disposition: A | Source: Ambulatory Visit | Attending: Cardiovascular Disease | Admitting: Cardiovascular Disease

## 2024-09-12 DIAGNOSIS — I7121 Aneurysm of the ascending aorta, without rupture: Secondary | ICD-10-CM | POA: Diagnosis not present

## 2024-09-12 LAB — POCT I-STAT CREATININE: Creatinine, Ser: 0.9 mg/dL (ref 0.44–1.00)

## 2024-09-12 MED ORDER — IOHEXOL 350 MG/ML SOLN
80.0000 mL | Freq: Once | INTRAVENOUS | Status: AC | PRN
  Administered 2024-09-12: 80 mL via INTRAVENOUS

## 2024-09-13 ENCOUNTER — Ambulatory Visit

## 2024-09-19 ENCOUNTER — Ambulatory Visit: Payer: Self-pay | Admitting: Cardiovascular Disease

## 2024-09-25 ENCOUNTER — Ambulatory Visit: Admitting: Cardiovascular Disease
